# Patient Record
Sex: Female | Born: 1960 | Race: White | Hispanic: No | Marital: Married | Smoking: Current every day smoker
Health system: Southern US, Community
[De-identification: ages and names within clinical notes are randomized; demographics above are authoritative.]

## PROBLEM LIST (undated history)

## (undated) DIAGNOSIS — G8929 Other chronic pain: Secondary | ICD-10-CM

## (undated) DIAGNOSIS — M1990 Unspecified osteoarthritis, unspecified site: Secondary | ICD-10-CM

## (undated) DIAGNOSIS — F419 Anxiety disorder, unspecified: Secondary | ICD-10-CM

## (undated) DIAGNOSIS — T7840XA Allergy, unspecified, initial encounter: Secondary | ICD-10-CM

## (undated) DIAGNOSIS — K5732 Diverticulitis of large intestine without perforation or abscess without bleeding: Secondary | ICD-10-CM

## (undated) DIAGNOSIS — Z87442 Personal history of urinary calculi: Secondary | ICD-10-CM

## (undated) DIAGNOSIS — Z9889 Other specified postprocedural states: Secondary | ICD-10-CM

## (undated) DIAGNOSIS — G2581 Restless legs syndrome: Secondary | ICD-10-CM

## (undated) DIAGNOSIS — B009 Herpesviral infection, unspecified: Secondary | ICD-10-CM

## (undated) DIAGNOSIS — E039 Hypothyroidism, unspecified: Secondary | ICD-10-CM

## (undated) DIAGNOSIS — E785 Hyperlipidemia, unspecified: Secondary | ICD-10-CM

## (undated) DIAGNOSIS — F32A Depression, unspecified: Secondary | ICD-10-CM

## (undated) DIAGNOSIS — M79606 Pain in leg, unspecified: Secondary | ICD-10-CM

## (undated) DIAGNOSIS — R112 Nausea with vomiting, unspecified: Secondary | ICD-10-CM

## (undated) DIAGNOSIS — F329 Major depressive disorder, single episode, unspecified: Secondary | ICD-10-CM

## (undated) HISTORY — PX: KNEE SURGERY: SHX244

## (undated) HISTORY — DX: Allergy, unspecified, initial encounter: T78.40XA

## (undated) HISTORY — DX: Herpesviral infection, unspecified: B00.9

## (undated) HISTORY — DX: Hyperlipidemia, unspecified: E78.5

## (undated) HISTORY — PX: COLONOSCOPY: SHX174

## (undated) HISTORY — DX: Restless legs syndrome: G25.81

## (undated) HISTORY — DX: Anxiety disorder, unspecified: F41.9

## (undated) HISTORY — DX: Diverticulitis of large intestine without perforation or abscess without bleeding: K57.32

---

## 1998-09-14 ENCOUNTER — Other Ambulatory Visit: Admission: RE | Admit: 1998-09-14 | Discharge: 1998-09-14 | Payer: Self-pay | Admitting: Obstetrics & Gynecology

## 1999-09-26 ENCOUNTER — Other Ambulatory Visit: Admission: RE | Admit: 1999-09-26 | Discharge: 1999-09-26 | Payer: Self-pay | Admitting: Obstetrics & Gynecology

## 2000-10-18 ENCOUNTER — Other Ambulatory Visit: Admission: RE | Admit: 2000-10-18 | Discharge: 2000-10-18 | Payer: Self-pay | Admitting: Obstetrics & Gynecology

## 2001-01-15 ENCOUNTER — Emergency Department (HOSPITAL_COMMUNITY): Admission: EM | Admit: 2001-01-15 | Discharge: 2001-01-15 | Payer: Self-pay | Admitting: Emergency Medicine

## 2001-10-06 ENCOUNTER — Other Ambulatory Visit: Admission: RE | Admit: 2001-10-06 | Discharge: 2001-10-06 | Payer: Self-pay | Admitting: Obstetrics & Gynecology

## 2002-10-09 ENCOUNTER — Other Ambulatory Visit: Admission: RE | Admit: 2002-10-09 | Discharge: 2002-10-09 | Payer: Self-pay | Admitting: Obstetrics & Gynecology

## 2003-10-20 ENCOUNTER — Other Ambulatory Visit: Admission: RE | Admit: 2003-10-20 | Discharge: 2003-10-20 | Payer: Self-pay | Admitting: Obstetrics & Gynecology

## 2003-10-26 ENCOUNTER — Emergency Department (HOSPITAL_COMMUNITY): Admission: EM | Admit: 2003-10-26 | Discharge: 2003-10-26 | Payer: Self-pay | Admitting: *Deleted

## 2004-09-15 ENCOUNTER — Observation Stay (HOSPITAL_COMMUNITY): Admission: EM | Admit: 2004-09-15 | Discharge: 2004-09-15 | Payer: Self-pay | Admitting: Podiatry

## 2005-08-29 ENCOUNTER — Other Ambulatory Visit: Admission: RE | Admit: 2005-08-29 | Discharge: 2005-08-29 | Payer: Self-pay | Admitting: Obstetrics & Gynecology

## 2005-08-30 ENCOUNTER — Other Ambulatory Visit: Admission: RE | Admit: 2005-08-30 | Discharge: 2005-08-30 | Payer: Self-pay | Admitting: Obstetrics & Gynecology

## 2010-02-13 ENCOUNTER — Emergency Department (HOSPITAL_COMMUNITY): Admission: EM | Admit: 2010-02-13 | Discharge: 2010-02-13 | Payer: Self-pay | Admitting: Emergency Medicine

## 2011-01-16 DIAGNOSIS — K5732 Diverticulitis of large intestine without perforation or abscess without bleeding: Secondary | ICD-10-CM

## 2011-01-16 HISTORY — DX: Diverticulitis of large intestine without perforation or abscess without bleeding: K57.32

## 2011-02-02 NOTE — H&P (Signed)
NAMEELENIE, COVEN NO.:  [id number]   MEDICAL RECORD NO.:  [id number]          PATIENT TYPE:  EMS   LOCATION:  MAJO                         FACILITY:  [facility]   PHYSICIAN:  Hettie Holstein, D.O.    DATE OF BIRTH:  17-Jun-1961   DATE OF ADMISSION:  09/15/2004  DATE OF DISCHARGE:                                HISTORY & PHYSICAL   Primary care physician:  Mosetta Putt, M.D.  Gynecologist:  Ilda Mori, M.D.   HISTORY OF PRESENT ILLNESS:  Ms. Metheny is a pleasant 50 year old Caucasian  female with a past medical history of benzodiazepine dependence and anxiety,  who presents with an intentional drug overdose.  She was witnessed by her  boyfriend to take a handful of pills mixed between an assortment of Xanax  and Valium.  Absolute composition not known.  In addition, Ms. Gilbert had  consumed approximately six beers.  This occurred around 3:30 a.m.  EMS  records currently not available to me; however, it is reported that she had  two bottles, that her boyfriend was quite certain no other drugs were  involved.   She has no prior history of suicidal attempts in the past.  It is unclear  whether she attempted to take her life at this time.  She stated that about  a week ago she was instructed to cut back on her Xanax and in combination  with the stress of the holidays, she felt as though this caused her to  decompensate.   PAST MEDICAL HISTORY:  Only available through her.  She is not the best  historian at this time and she is still a bit lethargic, however more alert  after receiving Romazicon in the emergency department.  She provides that  she has been on benzodiazepines for almost 25 years.  She has been on Xanax  for 25 and Valium for five years.  She states that these were initiated for  assistance in controlling her problems with endometriosis.  She denies any  surgical history.   MEDICATIONS:  1.  She takes Xanax 0.5 mg prescribed b.i.d.  Her boyfriend  states she takes      it t.i.d.  2.  Valium to take 5 mg q.h.s. p.r.n.   ALLERGIES:  She reports multiple and various drug allergies and  intolerances.  Uncertain that all of these are allergies, though she does  report respiratory difficulties with ASPIRIN and BENADRYL as well as  CODEINE.   SOCIAL HISTORY:  She is a pack per day smoker for many years.  She does  drink alcohol about twice per week, though she is able to stop for long  periods of time.  She works for a [employer].  She works as a Leisure centre manager.  She has no children.  She is accompanied by her boyfriend here in the  emergency department for two years.   FAMILY HISTORY:  Both parents are alive and well in their 49s, afflicted  only with diabetes.   REVIEW OF SYSTEMS:  This is unobtainable from the patient at this time.  As  noted above, she is quite a poor historian.   PHYSICAL EXAMINATION:  VITAL SIGNS:  Blood pressure 108/60, temperature  97.2, heart rate 94, respirations 20, O2 saturation on room air 100%.  GENERAL:  The patient is awake, though a bit lethargic, falling asleep  during our discussion.  She is able to answer simple questions.  Her affect  is a bit labile.  She is giggling at times and then somnolent.  HEENT:  Head normocephalic, atraumatic.  Extraocular muscles are intact.  Pupils are equal and reactive bilaterally.  NECK:  Supple, nontender.  There is no palpable thyromegaly or mass.  CARDIOVASCULAR:  She has normal S1, S2.  CHEST:  Lungs are clear to auscultation bilaterally.  ABDOMEN:  Soft, nontender.  EXTREMITIES:  No edema.  Peripheral pulses are palpable and symmetrical.  NEUROLOGIC:  Neurologic exam reveals, as noted above, her to have a labile  affect.  She exhibits no focal neurologic deficits.   LABORATORY DATA:  Only an I-STAT was performed in the emergency department,  revealing an initial pH of 7.96, PCO2 of 47, hemoglobin 14.3, sodium 146,  potassium 3.6, creatinine of 0.6, and  glucose 87.   IMPRESSION:  1.  Intentional drug overdose with benzodiazepines.  2.  Chronic benzodiazepine use and dependence.  3.  Anxiety and depression.  4.  Tobacco dependence.   PLAN:  At this time we are going to admit Ms. Jacome for observation on a  telemetry floor.  If she exhibits no evidence of decompensation over the  next 23 hours, would recommend discontinuing the telemetry.  We have asked  the assistance of Dr. Jeanie Sewer of psychiatry to assist in the management of  Ms. Peachey' presenting illness with suicidal attempt with a drug overdose.  Will follow her course clinically, place Romazicon for ready access if she  does decompensate on the floor.  Will follow her continuous pulse oximetry  and administer gentle IV fluids, as she is dehydrated at this time.  Will  replete her potassium as well.      Eric   ESS/MEDQ  D:  09/15/2004  T:  09/15/2004  Job:  [id number]   cc:   Mosetta Putt, M.D.  [address] [place]  [place] [address]  Fax: [phone]

## 2011-02-09 ENCOUNTER — Inpatient Hospital Stay (HOSPITAL_COMMUNITY)
Admission: EM | Admit: 2011-02-09 | Discharge: 2011-02-14 | DRG: 872 | Disposition: A | Discharge status: Home / Self Care | Payer: Self-pay | Attending: Internal Medicine | Admitting: Internal Medicine

## 2011-02-09 ENCOUNTER — Emergency Department (HOSPITAL_COMMUNITY): Payer: Self-pay

## 2011-02-09 DIAGNOSIS — K630 Abscess of intestine: Secondary | ICD-10-CM | POA: Diagnosis present

## 2011-02-09 DIAGNOSIS — A419 Sepsis, unspecified organism: Principal | ICD-10-CM | POA: Diagnosis present

## 2011-02-09 DIAGNOSIS — F17200 Nicotine dependence, unspecified, uncomplicated: Secondary | ICD-10-CM | POA: Diagnosis present

## 2011-02-09 DIAGNOSIS — E279 Disorder of adrenal gland, unspecified: Secondary | ICD-10-CM | POA: Diagnosis present

## 2011-02-09 DIAGNOSIS — F1010 Alcohol abuse, uncomplicated: Secondary | ICD-10-CM | POA: Diagnosis present

## 2011-02-09 DIAGNOSIS — D509 Iron deficiency anemia, unspecified: Secondary | ICD-10-CM | POA: Diagnosis present

## 2011-02-09 DIAGNOSIS — F411 Generalized anxiety disorder: Secondary | ICD-10-CM | POA: Diagnosis present

## 2011-02-09 DIAGNOSIS — E785 Hyperlipidemia, unspecified: Secondary | ICD-10-CM | POA: Diagnosis present

## 2011-02-09 DIAGNOSIS — K5732 Diverticulitis of large intestine without perforation or abscess without bleeding: Secondary | ICD-10-CM | POA: Diagnosis present

## 2011-02-09 DIAGNOSIS — E876 Hypokalemia: Secondary | ICD-10-CM | POA: Diagnosis present

## 2011-02-09 LAB — URINALYSIS, ROUTINE W REFLEX MICROSCOPIC
Bilirubin Urine: NEGATIVE
Glucose, UA: NEGATIVE mg/dL
Leukocytes, UA: NEGATIVE
Nitrite: NEGATIVE
Urobilinogen, UA: 1 mg/dL (ref 0.0–1.0)

## 2011-02-09 LAB — DIFFERENTIAL
Basophils Absolute: 0.1 10*3/uL (ref 0.0–0.1)
Eosinophils Absolute: 0.3 10*3/uL (ref 0.0–0.7)
Eosinophils Relative: 2 % (ref 0–5)
Lymphocytes Relative: 10 % — ABNORMAL LOW (ref 12–46)
Lymphs Abs: 1.8 10*3/uL (ref 0.7–4.0)
Monocytes Absolute: 1.5 10*3/uL — ABNORMAL HIGH (ref 0.1–1.0)
Monocytes Relative: 8 % (ref 3–12)
Neutro Abs: 14.3 10*3/uL — ABNORMAL HIGH (ref 1.7–7.7)

## 2011-02-09 LAB — URINE MICROSCOPIC-ADD ON

## 2011-02-09 LAB — COMPREHENSIVE METABOLIC PANEL
BUN: 8 mg/dL (ref 6–23)
Calcium: 9.2 mg/dL (ref 8.4–10.5)
Chloride: 98 mEq/L (ref 96–112)
Creatinine, Ser: 0.59 mg/dL (ref 0.4–1.2)
GFR calc Af Amer: >60 mL/min (ref >60)
Potassium: 3.8 mEq/L (ref 3.5–5.1)
Sodium: 136 mEq/L (ref 135–145)

## 2011-02-09 LAB — WET PREP, GENITAL
Trich, Wet Prep: NONE SEEN
WBC, Wet Prep HPF POC: NONE SEEN
Yeast Wet Prep HPF POC: NONE SEEN

## 2011-02-09 LAB — CBC
MCHC: 33.1 g/dL (ref 30.0–36.0)
Platelets: 313 10*3/uL (ref 150–400)
RBC: 4.33 MIL/uL (ref 3.87–5.11)
RDW: 13.2 % (ref 11.5–15.5)

## 2011-02-10 ENCOUNTER — Emergency Department (HOSPITAL_COMMUNITY): Payer: Self-pay

## 2011-02-10 ENCOUNTER — Inpatient Hospital Stay (HOSPITAL_COMMUNITY): Payer: Self-pay

## 2011-02-10 LAB — MAGNESIUM: Magnesium: 2.1 mg/dL (ref 1.5–2.5)

## 2011-02-10 LAB — PHOSPHORUS: Phosphorus: 3 mg/dL (ref 2.3–4.6)

## 2011-02-10 LAB — APTT: aPTT: 41 seconds — ABNORMAL HIGH (ref 24–37)

## 2011-02-10 LAB — LACTIC ACID, PLASMA: Lactic Acid, Venous: 0.7 mmol/L (ref 0.5–2.2)

## 2011-02-10 LAB — PROTIME-INR: INR: 1.15 (ref 0.00–1.49)

## 2011-02-10 MED ORDER — IOHEXOL 300 MG/ML  SOLN
125.0000 mL | Freq: Once | INTRAMUSCULAR | Status: AC | PRN
  Administered 2011-02-10: 125 mL via INTRAVENOUS

## 2011-02-10 NOTE — Consult Note (Signed)
NAMELAQUESHIA, CIHLAR                 ACCOUNT NO.:  [id number]  MEDICAL RECORD NO.:  [id number]           PATIENT TYPE:  I  LOCATION:  1224                         [facility]ACILITY:  [facility]  PHYSICIAN:  Angelia Mould. Derrell Lolling, M.D.DATE OF BIRTH:  1960-12-09  DATE OF CONSULTATION:  02/10/2011 DATE OF DISCHARGE:                                CONSULTATION   REASON FOR CONSULTATION:  Evaluate diverticulitis.  HISTORY OF PRESENT ILLNESS:  This is a 50 year old white female who noticed sudden onset of lower abdominal pain approximately 48 hours ago. She has been anorexic and has not eaten much since.  She has not had any vomiting.  She is chronically constipated.  Denies seeing blood in her stools.  She presented to the emergency room late last night.  Blood work showed white blood cell count of 17,800, hemoglobin 12.8.  Complete metabolic panel which was normal and lipase of 18.  CT scan showed inflamed sigmoid colon and diverticula consistent with sigmoid diverticulitis.  There was a phlegmon associated with the sigmoid colon but there was no drainable abscess.  No free air and no obstruction. The radiologist had some concern for microperforation because of the phlegmon.  Also noted was a 2.6 cm left adrenal mass which was nonspecific.  She was admitted by the medical hospitalist service and I was asked to see her in consultation.  PAST HISTORY:  She has hyperlipidemia.  Tobacco abuse.  Endoscopy for gastric polyp by history.  Never pregnant.  She has chronic anxiety. She has never had an abdominal operation.  CURRENT MEDICATIONS: 1. MiraLax. 2. Crestor. 3. Xanax.  DRUG ALLERGIES:  ASPIRIN, BENADRYL, PENICILLIN, DARVON, ADVIL, TYLENOL.  FAMILY HISTORY:  Father deceased, had hypertension and multiple myeloma. Mother living, has diabetes.  SOCIAL HISTORY:  The patient is single but has a long time live-in boyfriend.  She is unemployed.  She drinks wine weekly.  She smokes 1 pack of cigarettes  per day.  REVIEW OF SYSTEMS:  10-system review of systems is performed and is noncontributory and negative, except as described above.  PHYSICAL EXAMINATION:  GENERAL:  Pleasant, minimally confused, basically oriented Caucasian female who is in mild distress.  She does not look toxic at all. VITAL SIGNS:  Temperature 97.7, heart rate 91, blood pressure 109/68, oxygen saturation 94% on room air. HEENT:  Eyes, sclerae clear.  Extraocular movements intact.  Ears, mouth, throat, nose, lips, tongue and oropharynx without gross lesions. NECK:  Supple, nontender.  No mass.  No jugular venous distention. LUNGS:  Clear to auscultation.  No chest wall tenderness. BREASTS:  Not examined. HEART:  Regular rate and rhythm.  No ectopy.  No murmur. ABDOMEN:  Does not appear to be distended but there are minimal bowel sounds.  There are no scars.  The liver and spleen are not enlarged. There is no mass.  She is tender with guarding all the way across the lower abdomen.  Much softer in upper abdomen.  This is not a localizing process.  There is no palpable mass. GENITOURINARY:  There is no inguinal mass or adenopathy. EXTREMITIES:  She moves all 4 extremities  well without pain or deformity. NEUROLOGIC:  No gross motor or sensory deficits.  LABORATORY DATA:  Data ordered and reviewed.  I reviewed her CT scan of the abdomen and pelvis and all of her lab work and I have discussed her case with the [facility].  ASSESSMENT: 1. Acute sigmoid diverticulitis with possible microperforation and     localized peritonitis.  There is no evidence of obstruction, free     air or abscess at this time. 2. History of tobacco abuse. 3. History of regular alcohol use. 4. Left adrenal mass. 5. Chronic anxiety.  PLAN: 1. The patient needs to be admitted to the hospital, placed at bowel     rest and placed on broad-spectrum antibiotics. 2. Attention should be made to rehydrating her, as she does look a      little bit dehydrated. 3. I will follow along with you.  Hopefully, her peritonitis will     subside and we will be able to further evaluate and advise her     regarding long-term management of her diverticulitis as an     outpatient. 4. If she remains tender or her symptoms progress, she may require     laparotomy and Hartmann resection during this hospitalization.     This has been discussed with the patient in great detail.     Angelia Mould. Derrell Lolling, M.D.     HMI/MEDQ  D:  02/10/2011  T:  02/10/2011  Job:  [id number]  cc:   Mosetta Putt, M.D. Fax: [phone]  Electronically Signed by Claud Kelp M.D. on 02/10/2011 11:16:21 PM

## 2011-02-11 LAB — COMPREHENSIVE METABOLIC PANEL
AST: 13 U/L (ref 0–37)
BUN: 5 mg/dL — ABNORMAL LOW (ref 6–23)
CO2: 25 mEq/L (ref 19–32)
Calcium: 8.1 mg/dL — ABNORMAL LOW (ref 8.4–10.5)
Chloride: 105 mEq/L (ref 96–112)
Glucose, Bld: 90 mg/dL (ref 70–99)

## 2011-02-11 LAB — CBC
MCH: 29 pg (ref 26.0–34.0)
MCHC: 32.5 g/dL (ref 30.0–36.0)
MCV: 89.3 fL (ref 78.0–100.0)

## 2011-02-11 LAB — URINE CULTURE
Culture  Setup Time: 201205260221
Culture: NO GROWTH

## 2011-02-12 LAB — CBC
MCHC: 33.3 g/dL (ref 30.0–36.0)
RDW: 13.2 % (ref 11.5–15.5)

## 2011-02-12 LAB — BASIC METABOLIC PANEL
Calcium: 8.4 mg/dL (ref 8.4–10.5)
GFR calc Af Amer: >60 mL/min (ref >60)
GFR calc non Af Amer: >60 mL/min (ref >60)
Sodium: 139 mEq/L (ref 135–145)

## 2011-02-12 LAB — FOLATE: Folate: >20 ng/mL

## 2011-02-12 LAB — DIFFERENTIAL
Basophils Absolute: 0 10*3/uL (ref 0.0–0.1)
Basophils Relative: 0 % (ref 0–1)
Eosinophils Relative: 4 % (ref 0–5)
Monocytes Absolute: 0.7 10*3/uL (ref 0.1–1.0)
Neutro Abs: 5.8 10*3/uL (ref 1.7–7.7)

## 2011-02-12 LAB — FERRITIN: Ferritin: 129 ng/mL (ref 10–291)

## 2011-02-13 LAB — CBC
HCT: 31.3 % — ABNORMAL LOW (ref 36.0–46.0)
Hemoglobin: 10.1 g/dL — ABNORMAL LOW (ref 12.0–15.0)
MCH: 28.4 pg (ref 26.0–34.0)
MCHC: 32.3 g/dL (ref 30.0–36.0)

## 2011-02-13 LAB — BASIC METABOLIC PANEL
CO2: 23 mEq/L (ref 19–32)
Calcium: 8.5 mg/dL (ref 8.4–10.5)
Glucose, Bld: 119 mg/dL — ABNORMAL HIGH (ref 70–99)
Sodium: 139 mEq/L (ref 135–145)

## 2011-02-14 LAB — BASIC METABOLIC PANEL
CO2: 22 mEq/L (ref 19–32)
Chloride: 105 mEq/L (ref 96–112)
Creatinine, Ser: 0.56 mg/dL (ref 0.4–1.2)
GFR calc Af Amer: >60 mL/min (ref >60)
Potassium: 3.7 mEq/L (ref 3.5–5.1)

## 2011-02-15 NOTE — Discharge Summary (Addendum)
NAMECATHEY, Beth Walters                 ACCOUNT NO.:  [id number]  MEDICAL RECORD NO.:  [id number]           PATIENT TYPE:  I  LOCATION:  1319                         [facility]ACILITY:  [facility]  PHYSICIAN:  Andreas Blower, MD       DATE OF BIRTH:  10-06-1960  DATE OF ADMISSION:  02/09/2011 DATE OF DISCHARGE:  02/14/2011                              DISCHARGE SUMMARY   PRIMARY CARE PROVIDER:  Mosetta Putt, M.D.  GENERAL SURGEON:  Angelia Mould. Derrell Lolling, M.D.  DISCHARGE DIAGNOSES: 1. Diverticulitis with possible microperforation. 2. Systemic inflammatory response syndrome. 3. Adrenal mass. 4. Anxiety. 5. History of EtOH. 6. Anemia. 7. Hypokalemia. 8. Tobacco use.  DISCHARGE MEDICATIONS: 1. Ciprofloxacin 500 mg p.o. b.i.d. for 14 days. 2. Flagyl 500 mg p.o. t.i.d. for 14 days. 3. Phenergan 12.5 mg p.o. every 6 hours as needed for nausea. 4. Crestor 10 mg p.o. every evening. 5. Xanax 0.5 mg p.o. every 8 hours as needed for anxiety.  DIAGNOSTIC LABS:  WBC 17.8, hemoglobin 12.6, hematocrit 31.8, neutrophils 80%, absolute neutrophils 14.3.  Sodium 136, potassium 3.8, chloride 98, CO2 25, BUN 8, creatinine 0.59, glucose 104, lipase 18. Urinalysis with large blood, trace ketones, few bacteria, 3 to 6 RBCs. Wet prep with few clue cells, no yeasts, no trich.  MRSA PCR screening was negative.  PT 14.9, INR 1.15, PTT 41, magnesium 2.1, phosphorous 3.0.  Lactic acid 0.7.  Urine culture with no growth.  GC/Chlamydia negative.  Anemia panel yields iron level of 15, total iron binding capacity 255, percent saturation 6.  Vitamin B12 291, folate greater than 20.0.  DIAGNOSTIC IMAGING:  CT of the abdomen and pelvis yields prominent acute diverticulitis along the sigmoid colon with a large 8.5 x 4.9 cm phlegmon in the left in hemipelvis.  Associated large inflamed diverticula noted, underlying microperforation is a concern.  No definite abscess was seen.  Small amount of free fluid noted on the right side  of the pelvis.  Diverticulosis involving the descending and sigmoid colon.  Left ovary not well assessed due to adjacent inflammation.  A 2.6-cm mildly heterogenesis left adrenal mass noted. Small hiatal hernia noted.  Scattered calcification along the abdominal aorta and its branches.  Mild bibasilar atelectasis noted.  Abdominal and transvaginal ultrasound of pelvis yields question of adenomyosis given history of endometriosis and vague areas of decreased echogenicity within the uterine wall.  Moderate amount of free fluid within the pelvis is likely physiologic in nature.  No sonographic evidence for ovarian torsion.  CONSULTATIONS:  Dr. Claud Kelp from General Surgery on Feb 10, 2011.  PROCEDURES:  None.  BRIEF HISTORY:  Ms. Beth Walters is a 50 year old with a past medical history significant for anxiety, history of ovarian cyst and endometriosis who presented to the emergency room complaining of abdominal pain.  She also reported nausea and difficulty with urinating and defecating due to the abdominal pain.  She denied any diarrhea, dysuria or hematuria.  She denied fevers or chills.  She indicated the abdominal pain had started 2 days prior and described as crampy and sharp in quality and intermittent in nature.  She  indicated she felt like pain was getting worse, so she came to the emergency room.  She did report one episode of emesis in the emergency department.  She denied chest pain, shortness of breath, melena, rectal bleeding, vaginal bleeding.  We were asked to admit for further evaluation and treatment.  HOSPITAL COURSE BY PROBLEM: 1. Diverticulitis with possible microperforation.  The patient was     admitted to a regular floor.  She was evaluated by Dr. Claud Kelp on Feb 10, 2011.  CT of the abdomen showed an inflamed colon     and diverticula consistent with diverticulitis.  A phlegmon was     associated with the sigmoid colon but there was no drainable      abscess and no free air or obstruction.  There was some concern for     microperforation.  She was placed on bowel rest and IV Cipro and     Flagyl.  She gradually responded favorably with decreased pain.     She was started on a diet on Feb 12, 2011, but was advanced on Feb 13, 2011.  She tolerated well.  On the day of discharge, her pain     was within tolerable limits and she was tolerating her diet and     having formed bowel movements.  She will go home on 14 days of p.o.     Cipro and Flagyl.  She is to call Dr. Jacinto Halim office and make an     appointment with him for 2 weeks and he will assist her with long     term management of her diverticulosis.  She will remain on a low     residue diet for 2 weeks and then will go to a high-fiber diet. 2. Systemic inflammatory response syndrome.  On admission the patient     had a white count of 17.8, mild fever of 99.0, was tachycardic and     a systolic blood pressure of 100.  She responded favorably to IV     fluids and antibiotics.  She was afebrile during her     hospitalization with the exception of mild fever in the ED and her     white count is within normal limits. 3. Adrenal mass.  It was an incidental finding on CT.  She will     probably need an outpatient MRI to further evaluate.  She has an     appointment with her primary care physician in July.  She will     discuss this with him at that time. 4. Anemia secondary to iron deficiency.  Hemoglobin remained stable     during her hospitalization. 5. Anxiety.  Remained at baseline during hospitalization.  Initially     she was given IV Ativan.  On Feb 13, 2011, she was switched back to     her home dose of Xanax and Ativan was DC'ed 6. Hypokalemia.  On admission, her potassium was 3.3 that was repleted     and her potassium remained within normal limits. 7. History of EtOH use. 8. History of tobacco use.  The patient was counseled regarding     cessation and advised of the  risks of tobacco use.  PHYSICAL EXAMINATION:  Physical examination is documented in rounding note dated Feb 14, 2011.  FOLLOWUP:  The patient will call for appointment with Dr. Derrell Lolling in 2 weeks.  Has an appointment with Dr. Mosetta Putt, her  primary care provider, in July.  Will discuss evaluating adrenal mass and possibility of an outpatient MRI.  Will take 14 days of p.o. Cipro and Flagyl.  DIET:  Low residue diet for 2 weeks.  ACTIVITY:  Ad lib.  DISPOSITION:  The patient is medically stable and ready for discharge to home.  Time spent on this dictation was 40 minutes.     Gwenyth Bender, NP   ______________________________ Andreas Blower, MD    KMB/MEDQ  D:  02/14/2011  T:  02/14/2011  Job:  [id number]  cc:   Mosetta Putt, M.D. Fax: [phone]  Angelia Mould. Derrell Lolling, M.D. [address], [address] [place] [address]  Electronically Signed by Wardell Heath REDDY  on 02/15/2011 05:24:16 PM Electronically Signed by Toya Smothers  on 03/07/2011 07:31:19 AM

## 2011-02-16 LAB — CULTURE, BLOOD (ROUTINE X 2)
Culture  Setup Time: 201205262019
Culture: NO GROWTH

## 2011-02-18 NOTE — H&P (Signed)
Beth Walters, STROUGH                 ACCOUNT NO.:  [id number]  MEDICAL RECORD NO.:  [id number]           PATIENT TYPE:  E  LOCATION:  WLED                         FACILITY:  [facility]  PHYSICIAN:  Hartley Barefoot, MD    DATE OF BIRTH:  1961-02-23  DATE OF ADMISSION:  02/09/2011 DATE OF DISCHARGE:                             HISTORY & PHYSICAL   CHIEF COMPLAINT:  Abdominal pain.  BRIEF HISTORY OF PRESENT ILLNESS:  This is a 50 year old with past medical history significant for anxiety, history of ovarian cyst, and endometriosis, who presents to the emergency department complaining of abdominal pain.  She also relayed nausea and difficulty with urinating and defecating due to abdominal pain.  She denies diarrhea, dysuria, or hematuria.  No fevers or chills.  The abdominal pain started 2 days prior to admission, 10/10, intermittent, crampy and sharp in quality. The patient feels the pain is getting worse.  She vomited once in the emergency department.  She denies chest pain, shortness of breath, melena, rectal bleeding, vaginal bleeding, or discharge.  ALLERGIES:  She has allergy to; 1. PENICILLIN. 2. ASPIRIN. 3. BENADRYL. 4. TYLENOL. 5. IBUPROFEN  MEDICATIONS:  She takes Crestor and Xanax.  REVIEW OF SYSTEMS:  Negative, except as per HPI.  PAST MEDICAL HISTORY: 1. Anxiety. 2. Endometriosis. 3. Hyperlipidemia. 4. Ovarian cyst. 5. Prior history of suicidal attempts.  PAST SURGICAL HISTORY:  History of knee surgery.  SOCIAL HISTORY:  She is unemployed.  She smokes a pack a day for many years.  She drinks alcohol 3 times per week and 4 glasses of wine.  She denies recreational drugs.  She is single, and she does not have children.  FAMILY HISTORY:  Both parents are in their 79s, have history of diabetes.  PHYSICAL EXAMINATION:  GENERAL:  The patient sitting in bed, in no acute distress. VITAL SIGNS:  Blood pressure 106/72, pulse 108, saturations 94 on room air, pulse 108,  respirations 16, temperature 99. HEENT:  Head atraumatic, normocephalic.  Eyes anicteric.  Pupils equal and reactive to light.  Extraocular muscles intact. NECK:  Supple.  No rigidity, no thyromegaly. CARDIOVASCULAR:  S1 and S2, tachycardic.  Regular rhythm and rate.  No rubs, murmurs, or gallops. ABDOMEN:  Bowel sounds are present.  Decreased tenderness in bilateral lower quadrant.  No rigidity, no guarding. EXTREMITIES:  Pulse present.  No edema. SKIN:  No rash. NEUROLOGIC:  Alert and oriented x3.  Cranial nerves II through XII intact.  Sensation grossly intact.  Motor strength 5/5 throughout.  ADMISSION LABORATORY DATA:  White blood cells 17, hemoglobin 12, hematocrit 38, platelets 313.  Sodium 136, potassium 3.8, chloride 104, bicarb 25, BUN 8, creatinine 0.59, glucose 104.  UA with bacteria, large blood.  Pregnancy test negative.  Lipase 18.  RADIOGRAPHIC STUDIES:  CT abdomen and pelvis showed prominent acute diverticulitis along the sigmoid colon, with a large 8.5 x 4.9 cm phlegmon in the left hemipelvis.  Associated large inflamed diverticula noted.  Underlying microperforation is concerned.  No definite abscess is seen, a small amount of free fluid noted on the pelvis. Diverticulosis involving the descending and  sigmoid colon.  Left ovary not well assessed due to adjacent inflammation.  2.6 cm mildly heterogenous left adrenal mass noted.  This could be further addressed per CT, MRI protocol.  Mild bibasilar atelectasis.  Vaginal ultrasound, question adenomyosis, given history of endometriosis and vague areas of decreased echogenicity within the uterine wall.  Moderate amount of free fluid within the pelvis is likely physiologic in nature.  No sonographic evidence of ovarian torsion.  ASSESSMENT AND PLAN:  This is a 50 year old who presented with abdominal pain, found to have acute diverticulitis, questionable microperforation and phlegmon. 1. Diverticulitis.  We will admit  the patient to step-down unit.  We     will start IV antibiotics, Cipro and Flagyl.  She has history of     allergy to penicillin.  For that reason, I will avoid Zosyn;     n.p.o.; bowel rest; IV fluids; pain control.  Surgery consulted     already. 2. Systemic inflammatory response syndrome, possible early sepsis.     Systolic blood pressure in the 100 range.  The patient has mild     fever at 99.  She has leukocytosis of 17.  She is tachycardic.  I     will start IV fluids.  Will check lactic acid.  Admit to the step-     down unit.  Continue with IV antibiotics. 3. Anxiety.  The patient is on Xanax.  I will start Ativan p.r.n. 4. History of alcohol use.  We will start preventive thiamine and     folate.  Monitor CIWA protocol. 5. Adrenal mass, incidental finding on CT abdomen.  She will need at     some point an MRI and a workup. 6. Tobacco use.  We will order a nicotine patch as needed.     Hartley Barefoot, MD     BR/MEDQ  D:  02/10/2011  T:  02/10/2011  Job:  [id number]  Electronically Signed by Hartley Barefoot MD on 02/18/2011 09:35:02 PM

## 2011-03-06 ENCOUNTER — Encounter (INDEPENDENT_AMBULATORY_CARE_PROVIDER_SITE_OTHER): Payer: Self-pay | Admitting: General Surgery

## 2011-03-06 ENCOUNTER — Other Ambulatory Visit (INDEPENDENT_AMBULATORY_CARE_PROVIDER_SITE_OTHER): Payer: Self-pay | Admitting: General Surgery

## 2011-03-06 DIAGNOSIS — K5792 Diverticulitis of intestine, part unspecified, without perforation or abscess without bleeding: Secondary | ICD-10-CM

## 2011-03-07 ENCOUNTER — Inpatient Hospital Stay: Admission: RE | Admit: 2011-03-07 | Payer: Self-pay | Source: Ambulatory Visit

## 2011-03-07 ENCOUNTER — Ambulatory Visit
Admission: RE | Admit: 2011-03-07 | Discharge: 2011-03-07 | Disposition: A | Discharge status: Home / Self Care | Payer: Self-pay | Source: Ambulatory Visit | Attending: General Surgery | Admitting: General Surgery

## 2011-03-07 DIAGNOSIS — K5792 Diverticulitis of intestine, part unspecified, without perforation or abscess without bleeding: Secondary | ICD-10-CM

## 2011-03-07 MED ORDER — IOHEXOL 300 MG/ML  SOLN
100.0000 mL | Freq: Once | INTRAMUSCULAR | Status: AC | PRN
  Administered 2011-03-07: 100 mL via INTRAVENOUS

## 2011-03-08 ENCOUNTER — Encounter (INDEPENDENT_AMBULATORY_CARE_PROVIDER_SITE_OTHER): Payer: Self-pay | Admitting: General Surgery

## 2011-04-17 ENCOUNTER — Ambulatory Visit (AMBULATORY_SURGERY_CENTER): Payer: Self-pay | Admitting: *Deleted

## 2011-04-17 VITALS — Ht 64.0 in | Wt 157.0 lb

## 2011-04-17 DIAGNOSIS — Z1211 Encounter for screening for malignant neoplasm of colon: Secondary | ICD-10-CM

## 2011-04-17 MED ORDER — PEG-KCL-NACL-NASULF-NA ASC-C 100 G PO SOLR: ORAL | Status: DC

## 2011-05-01 ENCOUNTER — Encounter: Payer: Self-pay | Admitting: Gastroenterology

## 2011-05-01 ENCOUNTER — Ambulatory Visit (AMBULATORY_SURGERY_CENTER): Payer: Self-pay | Admitting: Gastroenterology

## 2011-05-01 DIAGNOSIS — Z1211 Encounter for screening for malignant neoplasm of colon: Secondary | ICD-10-CM

## 2011-05-01 DIAGNOSIS — K5732 Diverticulitis of large intestine without perforation or abscess without bleeding: Secondary | ICD-10-CM

## 2011-05-01 DIAGNOSIS — D126 Benign neoplasm of colon, unspecified: Secondary | ICD-10-CM

## 2011-05-01 DIAGNOSIS — D128 Benign neoplasm of rectum: Secondary | ICD-10-CM

## 2011-05-01 DIAGNOSIS — D129 Benign neoplasm of anus and anal canal: Secondary | ICD-10-CM

## 2011-05-01 MED ORDER — SODIUM CHLORIDE 0.9 % IV SOLN: 500.0000 mL | INTRAVENOUS | Status: DC

## 2011-05-01 NOTE — Patient Instructions (Signed)
Discharge instructions given with verbal understanding.  Handouts on polyps and diverticulosis given.  Resume previous medications. 

## 2011-05-02 ENCOUNTER — Telehealth: Payer: Self-pay

## 2011-05-02 NOTE — Telephone Encounter (Signed)

## 2011-05-07 ENCOUNTER — Encounter: Payer: Self-pay | Admitting: Gastroenterology

## 2011-11-22 ENCOUNTER — Other Ambulatory Visit: Payer: Self-pay | Admitting: Family Medicine

## 2011-11-22 ENCOUNTER — Ambulatory Visit
Admission: RE | Admit: 2011-11-22 | Discharge: 2011-11-22 | Disposition: A | Discharge status: Home / Self Care | Payer: Self-pay | Source: Ambulatory Visit | Attending: Family Medicine | Admitting: Family Medicine

## 2011-11-22 DIAGNOSIS — M25562 Pain in left knee: Secondary | ICD-10-CM

## 2012-01-15 ENCOUNTER — Other Ambulatory Visit: Payer: Self-pay | Admitting: Family Medicine

## 2012-01-15 ENCOUNTER — Ambulatory Visit
Admission: RE | Admit: 2012-01-15 | Discharge: 2012-01-15 | Disposition: A | Discharge status: Home / Self Care | Payer: Self-pay | Source: Ambulatory Visit | Attending: Family Medicine | Admitting: Family Medicine

## 2012-01-15 DIAGNOSIS — R609 Edema, unspecified: Secondary | ICD-10-CM

## 2012-06-09 IMAGING — CR DG CHEST 2V
2 series · 2 of 2 positions shown · non-contrast
Comparison: None.

CLINICAL DATA: Fever.

CHEST - 2 VIEW

[w chest pa]
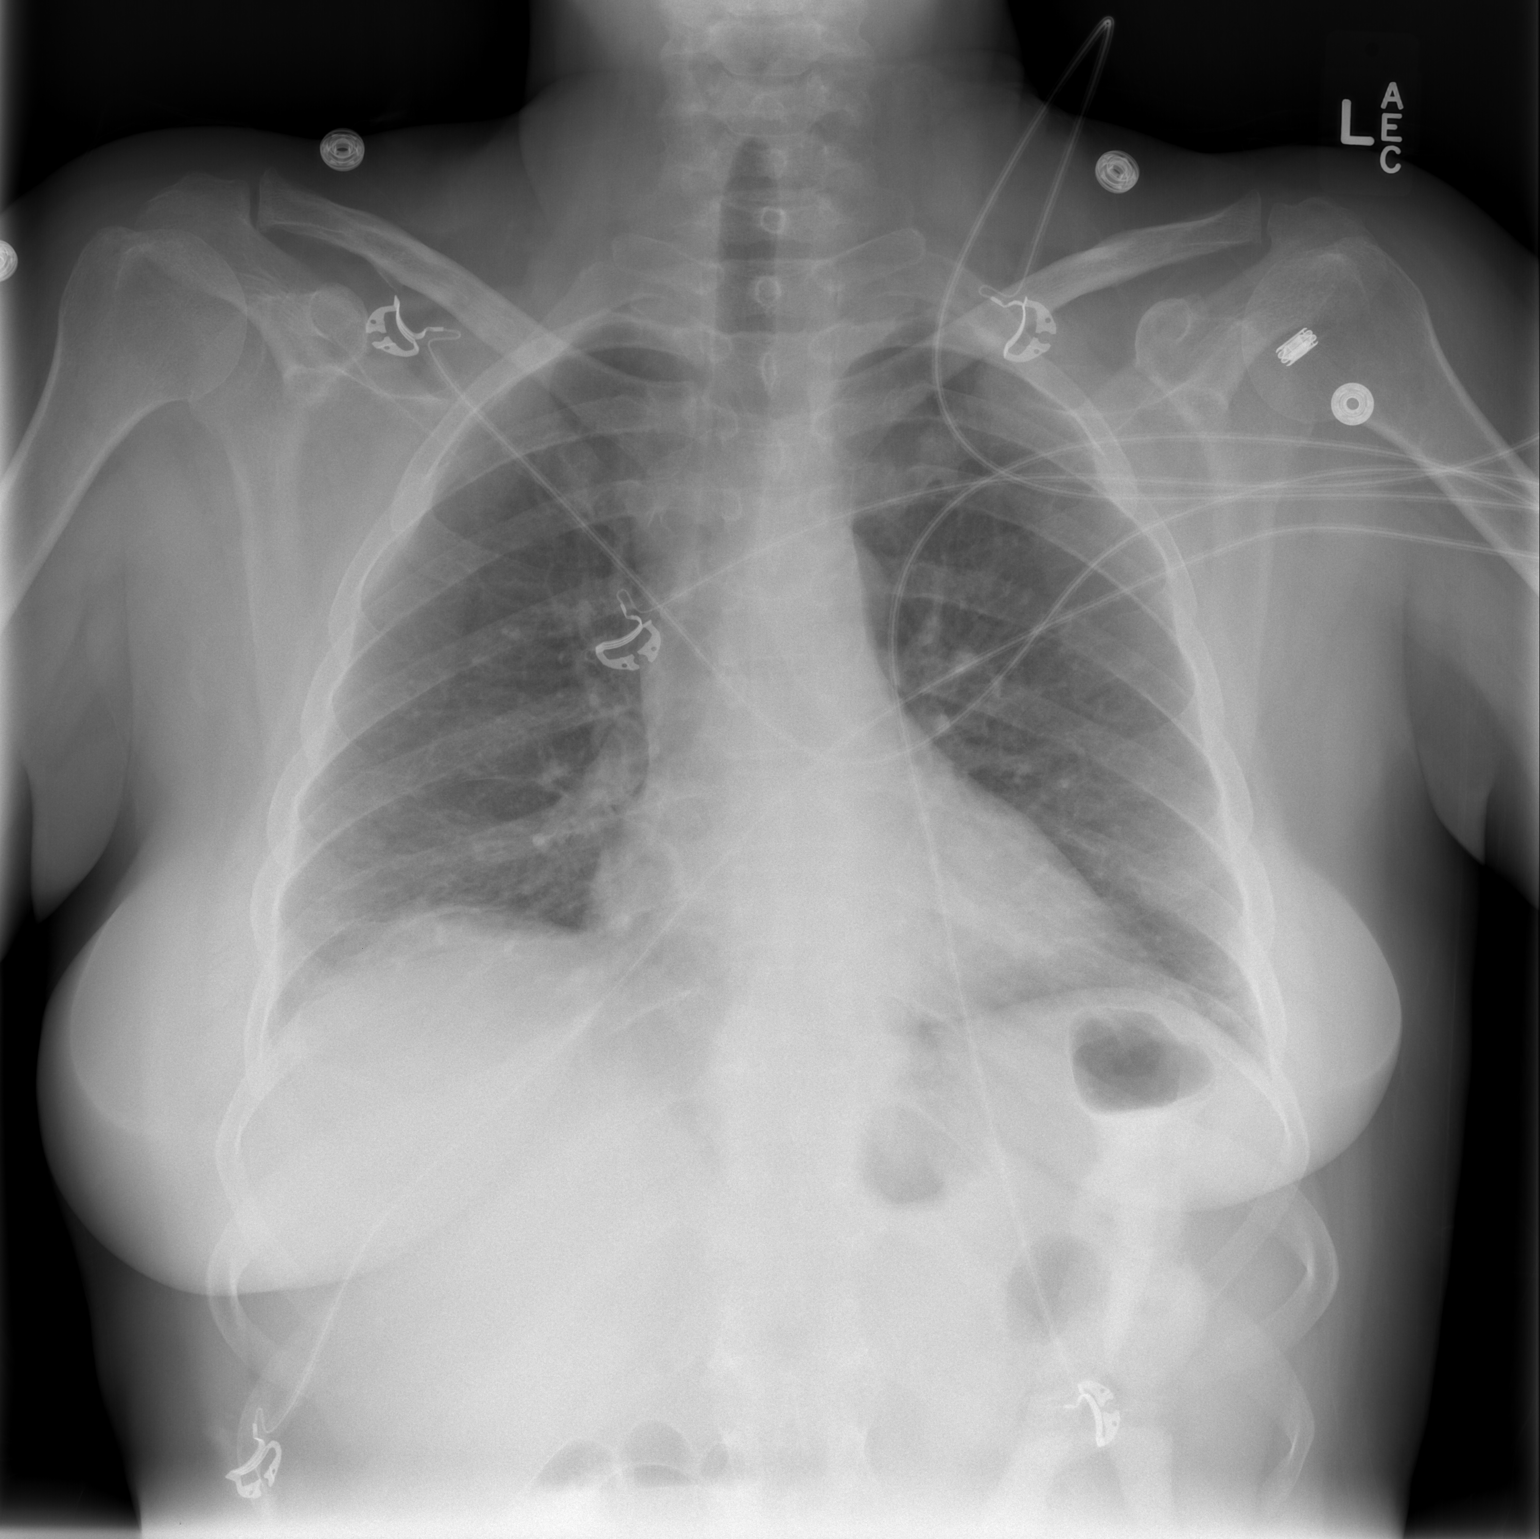

[w chest lat]
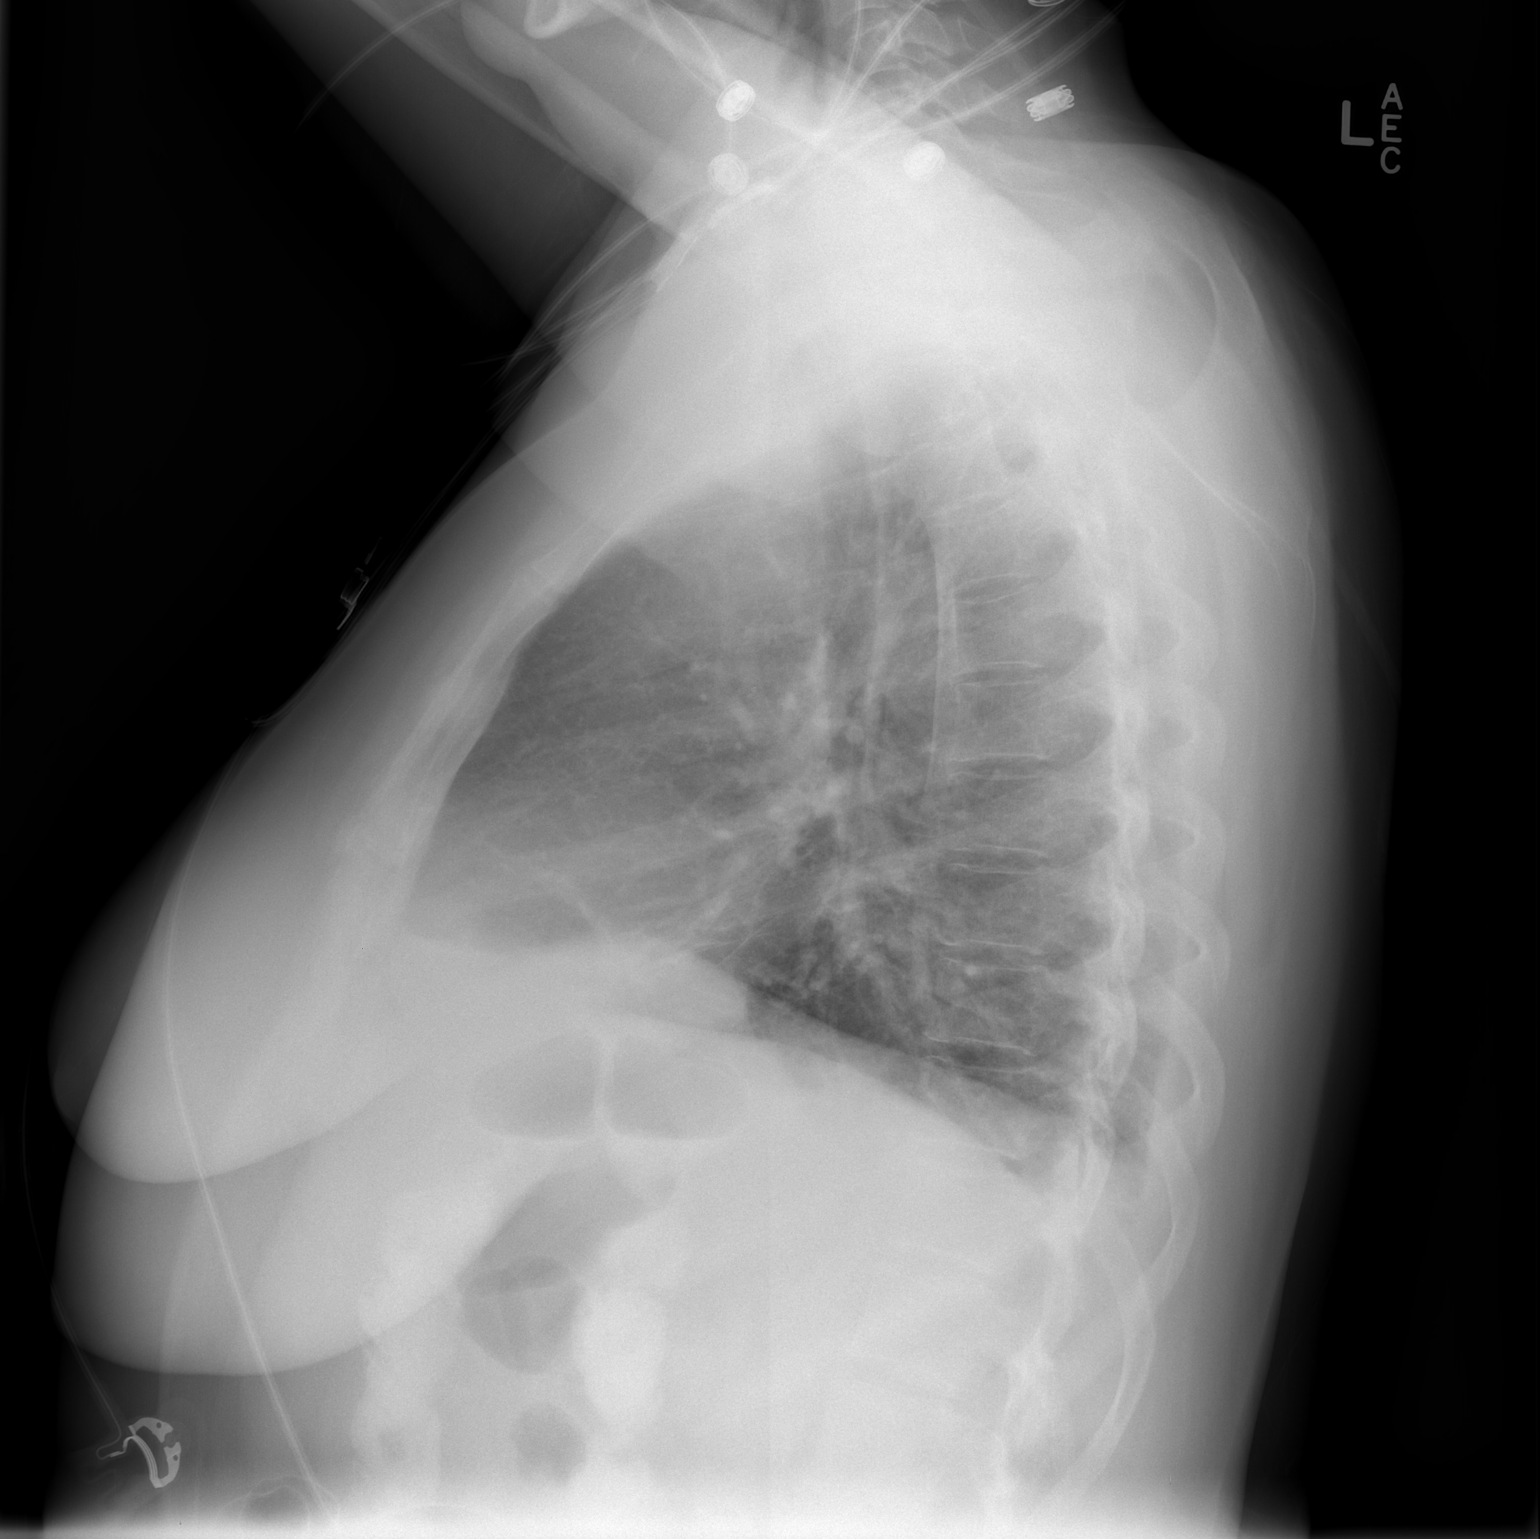

[2 of 2 positions shown; findings below may reference images not displayed]

FINDINGS: Normal sized heart.  Small amount of linear density at
the right lung base.  Clear left lung.  Minimal scoliosis.
IMPRESSION: Mild right basilar atelectasis or scarring.

## 2013-12-25 ENCOUNTER — Other Ambulatory Visit: Payer: Self-pay | Admitting: Family Medicine

## 2013-12-25 DIAGNOSIS — R1031 Right lower quadrant pain: Secondary | ICD-10-CM

## 2013-12-25 DIAGNOSIS — R1032 Left lower quadrant pain: Principal | ICD-10-CM

## 2013-12-30 ENCOUNTER — Other Ambulatory Visit: Payer: Self-pay

## 2016-02-07 ENCOUNTER — Encounter: Payer: Self-pay | Admitting: Gastroenterology

## 2018-04-23 ENCOUNTER — Ambulatory Visit: Payer: Self-pay | Admitting: Gastroenterology

## 2018-04-23 ENCOUNTER — Encounter: Payer: Self-pay | Admitting: Gastroenterology

## 2018-04-23 VITALS — BP 110/62 | HR 76 | Ht 63.0 in | Wt 153.8 lb

## 2018-04-23 DIAGNOSIS — Z86010 Personal history of colonic polyps: Secondary | ICD-10-CM

## 2018-04-23 DIAGNOSIS — R110 Nausea: Secondary | ICD-10-CM

## 2018-04-23 DIAGNOSIS — R1032 Left lower quadrant pain: Secondary | ICD-10-CM | POA: Insufficient documentation

## 2018-04-23 DIAGNOSIS — Z8719 Personal history of other diseases of the digestive system: Secondary | ICD-10-CM | POA: Insufficient documentation

## 2018-04-23 NOTE — Progress Notes (Signed)
04/23/2018 Beth Walters [id number] 09/11/1961   HISTORY OF PRESENT ILLNESS: This is a 57 year old female who is a patient of Dr. Lynne Leader.  She was hospitalized in May/June 2012 with significant diverticulitis.  She then underwent a follow-up colonoscopy in August 2012 at which time she was found to have moderate diverticulosis as well as a couple of polyps that were removed and were tubular adenomas.  Repeat colonoscopy was recommended in 5 years from that time.  She comes here today at the request of her PCP, Dr. Sandi Mariscal, for evaluation regarding left-sided abdominal pain.  She says that this initially started back in June.  They treated her with Cipro 500 mg twice daily for 10 days and Flagyl 500 mg twice daily for 10 days.  She says that her pain resolved, but then shortly after her birthday in [birth year] the pain returned and has been present since that time.  As stated, she's been having pain again now for the past 3 to 4 weeks.  It is in the left side.  She says that it radiates into her back at times and hurts worse with coughing.  She describes it as a burning type pain at times.  She denies seeing blood in her stools.  She does have some constipation but takes MiraLAX daily.  She admits to some nausea which Phenergan helps with, but denies any vomiting.  Feels feverish intermittently, but no objective fever.   Past Medical History:  Diagnosis Date  . Anxiety   . Diverticulitis of colon 01/2011  . Hyperlipidemia   . RLS (restless legs syndrome)    Past Surgical History:  Procedure Laterality Date  . KNEE SURGERY  20 yrs ago   left    reports that she has been smoking cigarettes.  She has been smoking about 0.50 packs per day. She has never used smokeless tobacco. She reports that she drinks alcohol. She reports that she does not use drugs. family history includes Colon cancer in her maternal grandmother; Colon polyps in her maternal grandmother; Diabetes in her mother; Hypertension in her  sister; Multiple myeloma in her father. Allergies  Allergen Reactions  . Acetaminophen Swelling  . Aspirin Swelling  . Benadryl [Diphenhydramine Hcl] Swelling  . Citalopram   . Clonidine Derivatives   . Codeine Swelling  . Demerol Swelling  . Fluoxetine   . Hydrocodone   . Ibuprofen Swelling  . Lexapro [Escitalopram]   . Lipitor [Atorvastatin]   . Lovastatin   . Meperidine And Related   . Metronidazole   . Niaspan [Niacin]   . Penicillins Hives and Swelling  . Pravastatin   . Simvastatin   . Tramadol       Outpatient Encounter Medications as of 04/23/2018  Medication Sig  . ALPRAZolam (XANAX) 0.5 MG tablet Take 0.5 mg by mouth 2 (two) times daily as needed.    Marland Kitchen levothyroxine (SYNTHROID, LEVOTHROID) 50 MCG tablet Take 1 tablet by mouth daily.  Marland Kitchen morphine (MSIR) 15 MG tablet Take 1 tablet by mouth daily as needed.  . Polyethylene Glycol 3350 (MIRALAX PO) Take 17 g by mouth daily.  . promethazine (PHENERGAN) 25 MG tablet Take 1-2 tablets by mouth daily as needed.  . rosuvastatin (CRESTOR) 40 MG tablet Take 20 mg by mouth daily.  Marland Kitchen zolpidem (AMBIEN) 10 MG tablet Take 10 mg by mouth at bedtime as needed for sleep.  . [DISCONTINUED] rosuvastatin (CRESTOR) 10 MG tablet Take 10 mg by mouth daily with supper.  No facility-administered encounter medications on file as of 04/23/2018.      REVIEW OF SYSTEMS  : All other systems reviewed and negative except where noted in the History of Present Illness.   PHYSICAL EXAM: BP 110/62   Pulse 76   Ht 5' 3"  (1.6 m)   Wt 153 lb 12.8 oz (69.8 kg)   BMI 27.24 kg/m   General: Well developed white female in no acute distress Head: Normocephalic and atraumatic Eyes:  Sclerae anicteric, conjunctiva pink. Ears: Normal auditory acuity Lungs: Clear throughout to auscultation; no increased WOB.   Heart: Regular rate and rhythm; no M/R/G. Abdomen: Soft, non-distended.  Normal bowel sounds.  Mild to moderate left sided TTP. Musculoskeletal:  Symmetrical with no gross deformities  Skin: No lesions on visible extremities Extremities: No edema  Neurological: Alert oriented x 4, grossly non-focal Psychological:  Alert and cooperative. Normal mood and affect  ASSESSMENT AND PLAN: *LLQ abdominal pain:  History of diverticulitis that was quite significant in 2012.  Was recently treated with 10 days of cipro and flagyl by her PCP but pain has returned.  Will get a CT scan abdomen and pelvis with contrast this week if possible. *Personal history of colon polyps:  Last colonoscopy 04/2011 with adenomatous polyps removed.  Repeat was recommended in 5 years.  Will schedule with Dr. Fuller Plan as he is scheduling in to October anyway.   CC:  Derinda Late, MD

## 2018-04-23 NOTE — Patient Instructions (Addendum)
You have been scheduled for a CT scan of the abdomen and pelvis at [facility] ([address]---this is in the same building as [facility]).   You are scheduled on 04-24-18 at 2:30 pm. You should arrive 15 minutes prior to your appointment time for registration. Please follow the written instructions below on the day of your exam:  WARNING: IF YOU ARE ALLERGIC TO IODINE/X-RAY DYE, PLEASE NOTIFY RADIOLOGY IMMEDIATELY AT [phone]! YOU WILL BE GIVEN A 13 HOUR PREMEDICATION PREP.  1) Do not eat or drink anything after 11:30 am (4 hours prior to your test) 2) You have been given 2 bottles of oral contrast to drink. The solution may taste better if refrigerated, but do NOT add ice or any other liquid to this solution. Shake well before drinking.    Drink 1 bottle of contrast @ 12:30 pm (2 hours prior to your exam)  Drink 1 bottle of contrast @ 1:30 pm (1 hour prior to your exam)  You may take any medications as prescribed with a small amount of water except for the following: Metformin, Glucophage, Glucovance, Avandamet, Riomet, Fortamet, Actoplus Met, Janumet, Glumetza or Metaglip. The above medications must be held the day of the exam AND 48 hours after the exam.  The purpose of you drinking the oral contrast is to aid in the visualization of your intestinal tract. The contrast solution may cause some diarrhea. Before your exam is started, you will be given a small amount of fluid to drink. Depending on your individual set of symptoms, you may also receive an intravenous injection of x-ray contrast/dye. Plan on being at [facility] for 30 minutes or longer, depending on the type of exam you are having performed.  This test typically takes 30-45 minutes to complete.  If you have any questions regarding your exam or if you need to reschedule, you may call the CT department at [phone] between the hours of 8:00 am and 5:00 pm,  Monday-Friday.  ________________________________________________________________________  Beth Walters have been scheduled for a colonoscopy. Please follow written instructions given to you at your visit today.  Please pick up your prep supplies at the pharmacy within the next 1-3 days. If you use inhalers (even only as needed), please bring them with you on the day of your procedure. Your physician has requested that you go to www.startemmi.com and enter the access code given to you at your visit today. This web site gives a general overview about your procedure. However, you should still follow specific instructions given to you by our office regarding your preparation for the procedure.

## 2018-04-24 ENCOUNTER — Ambulatory Visit (INDEPENDENT_AMBULATORY_CARE_PROVIDER_SITE_OTHER)
Admission: RE | Admit: 2018-04-24 | Discharge: 2018-04-24 | Disposition: A | Discharge status: Home / Self Care | Payer: Medicaid Other | Source: Ambulatory Visit | Attending: Gastroenterology | Admitting: Gastroenterology

## 2018-04-24 DIAGNOSIS — R1032 Left lower quadrant pain: Secondary | ICD-10-CM

## 2018-04-24 MED ORDER — IOPAMIDOL (ISOVUE-300) INJECTION 61%
100.0000 mL | Freq: Once | INTRAVENOUS | Status: AC | PRN
  Administered 2018-04-24: 100 mL via INTRAVENOUS

## 2018-04-28 ENCOUNTER — Telehealth: Payer: Self-pay

## 2018-04-28 NOTE — Telephone Encounter (Signed)
Beth Walters the pt is calling for CT results

## 2018-04-28 NOTE — Progress Notes (Signed)
Reviewed and agree with management plan.  Marua Qin T. Ranata Laughery, MD FACG 

## 2018-05-02 ENCOUNTER — Telehealth: Payer: Self-pay | Admitting: Gastroenterology

## 2018-05-02 NOTE — Telephone Encounter (Signed)
Patient states she was referred to [facility] from here but was treated very badly there and has not received her results. Patient is wanting to be referred somewhere else for her kidney stone by our office. Please advise.

## 2018-05-05 NOTE — Telephone Encounter (Signed)
[facility] will be faxing over referral form

## 2018-05-05 NOTE — Telephone Encounter (Signed)
[place], [place] [address] Phone: [phone] Hours of Operation Mon-Fri 8:00 am - 5:00 pm  The pt has been advised we can schedule with [facility]

## 2018-05-05 NOTE — Telephone Encounter (Signed)
Referral made, pt aware she will call if not heard from [facility] in a few days.

## 2018-05-07 ENCOUNTER — Other Ambulatory Visit: Payer: Self-pay | Admitting: Urology

## 2018-05-12 ENCOUNTER — Encounter (HOSPITAL_COMMUNITY): Payer: Self-pay | Admitting: *Deleted

## 2018-05-14 NOTE — H&P (Signed)
I have kidney stones.  HPI: Beth Walters is a 57 year-old female patient who was referred by Dr. Marylene Land, MD who is here for renal calculi.  The problem is on the left side. She first stated noticing pain on 03/23/2018. She is currently having flank pain. She denies having back pain, groin pain, nausea, vomiting, fever, and chills.   Patient with left flank pain and she underwent CT scan March 23, 2018. This revealed a 8 mm left proximal stone in the renal pelvis or at the UPJ with a skin to stone distance of 10 cm, Hounsfield units of 1014 and it was visible on the scout image.   Today, she has left flank pain radiating to left side. She is staying hydrated and voiding well. She has constipation. No gross hematuria.   Of note, she's on chronic morphine 15 mg for her legs and gets #15 per month.     ALLERGIES: acetaminophen - Swelling Aspirin - Swelling Benadryl Clonidine Derivatives  codeine - Swelling Demerol - Swelling fluoxetine Hydrocodone Ibuprofen - Swelling Lexapro lipitor lovastatin Meperidine metronidazole Niaspan penicillin - Swelling, Hives pravastatin Tramadol    MEDICATIONS: Levothyroxine Sodium 50 mcg tablet  Alprazolam 0.5 mg tablet  Miralax  Morphine Sulfate 15 mg tablet  Promethazine Hcl 25 mg tablet  Rosuvastatin Calcium 40 mg tablet  Zolpidem Tartrate 10 mg tablet     GU PSH: None   NON-GU PSH: Leg surgery (unspecified), Left    GU PMH: None   NON-GU PMH: Anxiety Depression Diverticulitis Hypercholesterolemia    FAMILY HISTORY: Death of family member - Mother, Father   SOCIAL HISTORY: Marital Status: Single Preferred Language: English; Ethnicity: Not Hispanic Or Latino; Race: White Current Smoking Status: Patient smokes. Has smoked since 04/17/1985. Smokes 1 pack per day.   Tobacco Use Assessment Completed: Used Tobacco in last 30 days? Does not use smokeless tobacco. Drinks 2 drinks per month. Types of alcohol consumed: Wine.   Does not drink caffeine.    REVIEW OF SYSTEMS:    GU Review Female:   Patient reports frequent urination, burning /pain with urination, and get up at night to urinate. Patient denies hard to postpone urination, leakage of urine, stream starts and stops, trouble starting your stream, have to strain to urinate, and being pregnant.  Gastrointestinal (Upper):   Patient denies nausea, vomiting, and indigestion/ heartburn.  Gastrointestinal (Lower):   Patient reports constipation. Patient denies diarrhea.  Constitutional:   Patient reports fatigue. Patient denies fever, night sweats, and weight loss.  Skin:   Patient denies skin rash/ lesion and itching.  Eyes:   Patient denies blurred vision and double vision.  Ears/ Nose/ Throat:   Patient reports sinus problems. Patient denies sore throat.  Hematologic/Lymphatic:   Patient denies swollen glands and easy bruising.  Cardiovascular:   Patient denies leg swelling and chest pains.  Respiratory:   Patient denies shortness of breath and cough.  Endocrine:   Patient denies excessive thirst.  Musculoskeletal:   Patient reports back pain and joint pain.   Neurological:   Patient denies headaches and dizziness.  Psychologic:   Patient reports depression and anxiety.    VITAL SIGNS:      05/01/2018 09:35 AM  Weight 153 lb / 69.4 kg  Height 63 in / 160.02 cm  BP 161/79 mmHg  Pulse 75 /min  Temperature 97.1 F / 36.1 C  BMI 27.1 kg/m   MULTI-SYSTEM PHYSICAL EXAMINATION:    Constitutional: Well-nourished. No physical deformities. Normally developed.  Good grooming.  Neck: Neck symmetrical, not swollen. Normal tracheal position.  Respiratory: No labored breathing, no use of accessory muscles.   Cardiovascular: Normal temperature, normal extremity pulses, no swelling, no varicosities.  Skin: No paleness, no jaundice, no cyanosis. No lesion, no ulcer, no rash.  Neurologic / Psychiatric: Oriented to time, oriented to place, oriented to person. No  depression, no anxiety, no agitation.  Gastrointestinal: No mass, no tenderness, no rigidity, non obese abdomen.  Eyes: Normal conjunctivae. Normal eyelids.  Ears, Nose, Mouth, and Throat: Left ear no scars, no lesions, no masses. Right ear no scars, no lesions, no masses. Nose no scars, no lesions, no masses. Normal hearing. Normal lips.  Musculoskeletal: Normal gait and station of head and neck.     PAST DATA REVIEWED:  Source Of History:  Patient  X-Ray Review: C.T. Abdomen/Pelvis: Reviewed Films. 2019    PROCEDURES:         KUB - [id number]  A single view of the abdomen is obtained.  Fecal Stasis:  Moderate fecal stasis. Contrast noted in the sigmoid colon.  Calculi:  10 mm left renal pelvic stone      The bones appeared normal. The bowel gas pattern appeared normal. The soft tissues were unremarkable.         Urinalysis w/Scope Dipstick Dipstick Cont'd Micro  Color: Straw Bilirubin: Neg mg/dL WBC/hpf: 0 - 5/hpf  Appearance: Clear Ketones: Neg mg/dL RBC/hpf: 0 - 2/hpf  Specific Gravity: <=1.005 Blood: Trace ery/uL Bacteria: NS (Not Seen)  pH: 6.0 Protein: Neg mg/dL Cystals: NS (Not Seen)  Glucose: Neg mg/dL Urobilinogen: 0.2 mg/dL Casts: NS (Not Seen)    Nitrites: Neg Trichomonas: Not Present    Leukocyte Esterase: Neg leu/uL Mucous: Not Present      Epithelial Cells: 0 - 5/hpf      Yeast: NS (Not Seen)      Sperm: Not Present    ASSESSMENT:      ICD-10 Details  1 GU:   Renal calculus - N20.0    PLAN:           Orders X-Rays: KUB          Schedule Return Visit/Planned Activity: Next Available Appointment - Schedule Surgery          Document Letter(s):  Created for Patient: Clinical Summary         Notes:   Left renal stone-intubation a picture of the anatomy and we went over the nature risk and benefits of continued surveillance, ureteroscopy or shockwave lithotripsy. She is a good candidate for shockwave but with chronic morphine use and an allergy to Benadryl  she'll need MAC anesthesia. We'll also ask Dr. Sandi Mariscal if it is okay to give any postop pain medicine per the chronic pain protocol for PSC. UA clear. Discussed risk of bleeding and infection among others. Also she may need a staged procedure.    After a thorough review of the management options for the patient's condition the patient  elected to proceed with surgical therapy as noted above. We have discussed the potential benefits and risks of the procedure, side effects of the proposed treatment, the likelihood of the patient achieving the goals of the procedure, and any potential problems that might occur during the procedure or recuperation. Informed consent has been obtained.

## 2018-05-15 ENCOUNTER — Encounter (HOSPITAL_COMMUNITY)
Admission: RE | Disposition: A | Discharge status: Home / Self Care | Payer: Self-pay | Source: Ambulatory Visit | Attending: Urology

## 2018-05-15 ENCOUNTER — Ambulatory Visit (HOSPITAL_COMMUNITY)
Admission: RE | Admit: 2018-05-15 | Discharge: 2018-05-15 | Disposition: A | Discharge status: Home / Self Care | Payer: Medicaid Other | Source: Ambulatory Visit | Attending: Urology | Admitting: Urology

## 2018-05-15 ENCOUNTER — Encounter (HOSPITAL_COMMUNITY): Payer: Self-pay | Admitting: General Practice

## 2018-05-15 ENCOUNTER — Ambulatory Visit (HOSPITAL_COMMUNITY): Payer: Medicaid Other | Admitting: Anesthesiology

## 2018-05-15 ENCOUNTER — Ambulatory Visit (HOSPITAL_COMMUNITY): Payer: Medicaid Other

## 2018-05-15 DIAGNOSIS — E039 Hypothyroidism, unspecified: Secondary | ICD-10-CM | POA: Insufficient documentation

## 2018-05-15 DIAGNOSIS — F419 Anxiety disorder, unspecified: Secondary | ICD-10-CM | POA: Diagnosis not present

## 2018-05-15 DIAGNOSIS — F329 Major depressive disorder, single episode, unspecified: Secondary | ICD-10-CM | POA: Insufficient documentation

## 2018-05-15 DIAGNOSIS — E785 Hyperlipidemia, unspecified: Secondary | ICD-10-CM | POA: Diagnosis not present

## 2018-05-15 DIAGNOSIS — G8929 Other chronic pain: Secondary | ICD-10-CM | POA: Diagnosis not present

## 2018-05-15 DIAGNOSIS — N200 Calculus of kidney: Secondary | ICD-10-CM | POA: Diagnosis not present

## 2018-05-15 DIAGNOSIS — G2581 Restless legs syndrome: Secondary | ICD-10-CM | POA: Diagnosis not present

## 2018-05-15 DIAGNOSIS — F17200 Nicotine dependence, unspecified, uncomplicated: Secondary | ICD-10-CM | POA: Diagnosis not present

## 2018-05-15 HISTORY — DX: Personal history of urinary calculi: Z87.442

## 2018-05-15 HISTORY — DX: Other chronic pain: G89.29

## 2018-05-15 HISTORY — DX: Other specified postprocedural states: R11.2

## 2018-05-15 HISTORY — DX: Hypothyroidism, unspecified: E03.9

## 2018-05-15 HISTORY — DX: Major depressive disorder, single episode, unspecified: F32.9

## 2018-05-15 HISTORY — DX: Depression, unspecified: F32.A

## 2018-05-15 HISTORY — DX: Other specified postprocedural states: Z98.890

## 2018-05-15 HISTORY — DX: Unspecified osteoarthritis, unspecified site: M19.90

## 2018-05-15 HISTORY — PX: EXTRACORPOREAL SHOCK WAVE LITHOTRIPSY: SHX1557

## 2018-05-15 HISTORY — DX: Pain in leg, unspecified: M79.606

## 2018-05-15 SURGERY — LITHOTRIPSY, ESWL
Anesthesia: Monitor Anesthesia Care | Laterality: Left

## 2018-05-15 MED ORDER — FENTANYL CITRATE (PF) 100 MCG/2ML IJ SOLN: 25.0000 ug | INTRAMUSCULAR | Status: DC | PRN

## 2018-05-15 MED ORDER — FENTANYL CITRATE (PF) 100 MCG/2ML IJ SOLN
INTRAMUSCULAR | Status: DC | PRN
  Administered 2018-05-15: 25 ug via INTRAVENOUS
  Administered 2018-05-15: 50 ug via INTRAVENOUS
  Administered 2018-05-15: 25 ug via INTRAVENOUS

## 2018-05-15 MED ORDER — DIAZEPAM 5 MG PO TABS: 10.0000 mg | ORAL_TABLET | ORAL | Status: DC

## 2018-05-15 MED ORDER — DIPHENHYDRAMINE HCL 25 MG PO CAPS: 25.0000 mg | ORAL_CAPSULE | ORAL | Status: DC

## 2018-05-15 MED ORDER — MIDAZOLAM HCL 2 MG/2ML IJ SOLN
INTRAMUSCULAR | Status: AC
  Filled 2018-05-15: qty 2

## 2018-05-15 MED ORDER — PROPOFOL 500 MG/50ML IV EMUL
INTRAVENOUS | Status: DC | PRN
  Administered 2018-05-15: 50 ug/kg/min via INTRAVENOUS

## 2018-05-15 MED ORDER — PROPOFOL 10 MG/ML IV BOLUS
INTRAVENOUS | Status: AC
  Filled 2018-05-15: qty 40

## 2018-05-15 MED ORDER — ONDANSETRON HCL 4 MG/2ML IJ SOLN: 4.0000 mg | Freq: Once | INTRAMUSCULAR | Status: DC | PRN

## 2018-05-15 MED ORDER — PROPOFOL 500 MG/50ML IV EMUL
INTRAVENOUS | Status: DC | PRN
  Administered 2018-05-15: 20 mg via INTRAVENOUS

## 2018-05-15 MED ORDER — MIDAZOLAM HCL 5 MG/5ML IJ SOLN
INTRAMUSCULAR | Status: DC | PRN
  Administered 2018-05-15 (×2): 1 mg via INTRAVENOUS

## 2018-05-15 MED ORDER — FENTANYL CITRATE (PF) 100 MCG/2ML IJ SOLN
INTRAMUSCULAR | Status: AC
  Filled 2018-05-15: qty 2

## 2018-05-15 MED ORDER — SODIUM CHLORIDE 0.9 % IV SOLN
INTRAVENOUS | Status: DC
  Administered 2018-05-15: 07:00:00 via INTRAVENOUS

## 2018-05-15 MED ORDER — CIPROFLOXACIN HCL 500 MG PO TABS
500.0000 mg | ORAL_TABLET | ORAL | Status: AC
  Administered 2018-05-15: 500 mg via ORAL
  Filled 2018-05-15: qty 1

## 2018-05-15 MED ORDER — SODIUM CHLORIDE 0.9 % IV SOLN: INTRAVENOUS | Status: DC

## 2018-05-15 MED ORDER — HYDROMORPHONE HCL 1 MG/ML IJ SOLN: 0.2500 mg | INTRAMUSCULAR | Status: DC | PRN

## 2018-05-15 NOTE — Transfer of Care (Signed)
Immediate Anesthesia Transfer of Care Note  Patient: Beth Walters  Procedure(s) Performed: LEFT EXTRACORPOREAL SHOCK WAVE LITHOTRIPSY (ESWL) WITH MAC (Left )  Patient Location: PACU  Anesthesia Type:MAC  Level of Consciousness: awake, alert  and oriented  Airway & Oxygen Therapy: Patient Spontanous Breathing  Post-op Assessment: Report given to RN and Post -op Vital signs reviewed and stable  Post vital signs: Reviewed and stable  Last Vitals:  Vitals Value Taken Time  BP    Temp    Pulse    Resp    SpO2      Last Pain:  Vitals:   05/15/18 0631  TempSrc: Oral  PainSc: 8       Patients Stated Pain Goal: 6 (46/65/99 3570)  Complications: No apparent anesthesia complications

## 2018-05-15 NOTE — Interval H&P Note (Signed)
History and Physical Interval Note:  05/15/2018 7:13 AM  Beth Walters  has presented today for surgery, with the diagnosis of LEFT RENAL STONE  The various methods of treatment have been discussed with the patient and family. After consideration of risks, benefits and other options for treatment, the patient has consented to  Procedure(s): LEFT EXTRACORPOREAL SHOCK WAVE LITHOTRIPSY (ESWL) WITH MAC (Left) as a surgical intervention .  The patient's history has been reviewed, patient examined, no change in status, stable for surgery.  I have reviewed the patient's chart and labs.  Questions were answered to the patient's satisfaction.     [name]

## 2018-05-15 NOTE — Anesthesia Preprocedure Evaluation (Addendum)
Anesthesia Evaluation  Patient identified by MRN, date of birth, ID band Patient awake    Reviewed: Allergy & Precautions, NPO status , Patient's Chart, lab work & pertinent test results  History of Anesthesia Complications (+) PONV and history of anesthetic complications  Airway Mallampati: II  TM Distance: >3 FB Neck ROM: Full    Dental no notable dental hx.    Pulmonary Current Smoker,    Pulmonary exam normal breath sounds clear to auscultation       Cardiovascular negative cardio ROS Normal cardiovascular exam Rhythm:Regular Rate:Normal     Neuro/Psych PSYCHIATRIC DISORDERS Anxiety Depression negative neurological ROS     GI/Hepatic negative GI ROS, Neg liver ROS,   Endo/Other  Hypothyroidism   Renal/GU negative Renal ROS     Musculoskeletal RLS (restless legs syndrome) Chronic pain on narcotics   Abdominal   Peds  Hematology HLD   Anesthesia Other Findings LEFT RENAL STONE  Reproductive/Obstetrics                            Anesthesia Physical Anesthesia Plan  ASA: III  Anesthesia Plan: MAC   Post-op Pain Management:    Induction: Intravenous  PONV Risk Score and Plan: 2 and Propofol infusion and Treatment may vary due to age or medical condition  Airway Management Planned: Nasal Cannula  Additional Equipment:   Intra-op Plan:   Post-operative Plan:   Informed Consent: I have reviewed the patients History and Physical, chart, labs and discussed the procedure including the risks, benefits and alternatives for the proposed anesthesia with the patient or authorized representative who has indicated his/her understanding and acceptance.   Dental advisory given  Plan Discussed with: CRNA  Anesthesia Plan Comments:         Anesthesia Quick Evaluation

## 2018-05-15 NOTE — Op Note (Signed)
Preoperative diagnosis: Left renal stone Postoperative diagnosis: Left renal stone Surgery: Extracorporal lithotripsy left renal stone Surgeon: Dr. Nicki Reaper Mcdiarmid  The patient was prepped and draped in usual fashion.  Monitored anesthesia was utilized.  She received 4000 shocks to the 8 mm stone at the level of the left ureteropelvic junction.  She had excellent fragmentation.  Details of treatment are on the scanned document noted from the lithotripsy center

## 2018-05-16 NOTE — Anesthesia Postprocedure Evaluation (Signed)
Anesthesia Post Note  Patient: Beth Walters  Procedure(s) Performed: LEFT EXTRACORPOREAL SHOCK WAVE LITHOTRIPSY (ESWL) WITH MAC (Left )     Patient location during evaluation: PACU Anesthesia Type: MAC Level of consciousness: awake and alert Pain management: pain level controlled Vital Signs Assessment: post-procedure vital signs reviewed and stable Respiratory status: spontaneous breathing, nonlabored ventilation, respiratory function stable and patient connected to nasal cannula oxygen Cardiovascular status: stable and blood pressure returned to baseline Postop Assessment: no apparent nausea or vomiting Anesthetic complications: no    Last Vitals:  Vitals:   05/15/18 0901 05/15/18 0916  BP: (!) 153/84 (!) 150/80  Pulse: 64 (!) 59  Resp: 18 18  Temp:  (!) 36.3 C  SpO2: 100% 97%    Last Pain:  Vitals:   05/15/18 0853  TempSrc:   PainSc: 7                  Ryan P Ellender

## 2018-06-09 ENCOUNTER — Encounter (HOSPITAL_COMMUNITY): Payer: Self-pay | Admitting: Urology

## 2018-06-11 ENCOUNTER — Telehealth: Payer: Self-pay | Admitting: Gastroenterology

## 2018-06-11 MED ORDER — NA SULFATE-K SULFATE-MG SULF 17.5-3.13-1.6 GM/177ML PO SOLN: 1.0000 | ORAL | 0 refills | Status: DC

## 2018-06-11 NOTE — Telephone Encounter (Signed)
Rx sent to pharmacy   

## 2018-06-11 NOTE — Telephone Encounter (Signed)
Pt needs suprep sent to [facility] on [address].

## 2018-06-11 NOTE — Addendum Note (Signed)
Addended by: Wyline Beady on: 06/11/2018 11:16 AM   Modules accepted: Orders

## 2018-06-18 ENCOUNTER — Ambulatory Visit (AMBULATORY_SURGERY_CENTER): Payer: Medicaid Other | Admitting: Gastroenterology

## 2018-06-18 ENCOUNTER — Encounter: Payer: Self-pay | Admitting: Gastroenterology

## 2018-06-18 ENCOUNTER — Other Ambulatory Visit: Payer: Self-pay

## 2018-06-18 VITALS — BP 137/64 | HR 67 | Temp 98.4°F | Resp 11 | Ht 63.0 in | Wt 153.0 lb

## 2018-06-18 DIAGNOSIS — D122 Benign neoplasm of ascending colon: Secondary | ICD-10-CM

## 2018-06-18 DIAGNOSIS — D129 Benign neoplasm of anus and anal canal: Secondary | ICD-10-CM

## 2018-06-18 DIAGNOSIS — K621 Rectal polyp: Secondary | ICD-10-CM

## 2018-06-18 DIAGNOSIS — Z86010 Personal history of colonic polyps: Secondary | ICD-10-CM

## 2018-06-18 DIAGNOSIS — D128 Benign neoplasm of rectum: Secondary | ICD-10-CM

## 2018-06-18 MED ORDER — SODIUM CHLORIDE 0.9 % IV SOLN: 500.0000 mL | Freq: Once | INTRAVENOUS | Status: AC

## 2018-06-18 NOTE — Op Note (Signed)
[place] Patient Name: Beth Walters Procedure Date: 06/18/2018 3:16 PM MRN: [id number] Endoscopist: Ladene Artist , MD Age: 57 Referring MD:  Date of Birth: [birth year] Gender: Female Account #: [id number] Procedure:                Colonoscopy Indications:              Surveillance: Personal history of adenomatous                            polyps on last colonoscopy > 5 years ago Medicines:                Monitored Anesthesia Care Procedure:                Pre-Anesthesia Assessment:                           - Prior to the procedure, a History and Physical                            was performed, and patient medications and                            allergies were reviewed. The patient's tolerance of                            previous anesthesia was also reviewed. The risks                            and benefits of the procedure and the sedation                            options and risks were discussed with the patient.                            All questions were answered, and informed consent                            was obtained. Prior Anticoagulants: The patient has                            taken no previous anticoagulant or antiplatelet                            agents. ASA Grade Assessment: II - A patient with                            mild systemic disease. After reviewing the risks                            and benefits, the patient was deemed in                            satisfactory condition to undergo the procedure.  After obtaining informed consent, the colonoscope                            was passed under direct vision. Throughout the                            procedure, the patient's blood pressure, pulse, and                            oxygen saturations were monitored continuously. The                            Colonoscope was introduced through the anus and                            advanced to the the cecum,  identified by                            appendiceal orifice and ileocecal valve. The                            ileocecal valve, appendiceal orifice, and rectum                            were photographed. The quality of the bowel                            preparation was excellent. The colonoscopy was                            performed without difficulty. The patient tolerated                            the procedure well. Scope In: 3:20:14 PM Scope Out: 3:33:10 PM Scope Withdrawal Time: 0 hours 9 minutes 38 seconds  Total Procedure Duration: 0 hours 12 minutes 56 seconds  Findings:                 The perianal and digital rectal examinations were                            normal.                           Two sessile polyps were found in the rectum and                            ascending colon. The polyps were 6 to 7 mm in size.                            These polyps were removed with a cold snare.                            Resection and retrieval were complete.  Multiple medium-mouthed diverticula were found in                            the left colon. There was narrowing of the colon in                            association with the diverticular opening. There                            was evidence of diverticular spasm. There was no                            evidence of diverticular bleeding.                           Internal hemorrhoids were found during                            retroflexion. The hemorrhoids were small and Grade                            I (internal hemorrhoids that do not prolapse).                           The exam was otherwise without abnormality on                            direct and retroflexion views. Complications:            No immediate complications. Estimated blood loss:                            None. Estimated Blood Loss:     Estimated blood loss: none. Impression:               - Two 6 to 7 mm polyps in  the rectum and in the                            ascending colon, removed with a cold snare.                            Resected and retrieved.                           - Moderate diverticulosis in the left colon.                           - Internal hemorrhoids.                           - The examination was otherwise normal on direct                            and retroflexion views. Recommendation:           - Repeat colonoscopy in 5 years for surveillance.                           -  Patient has a contact number available for                            emergencies. The signs and symptoms of potential                            delayed complications were discussed with the                            patient. Return to normal activities tomorrow.                            Written discharge instructions were provided to the                            patient.                           - High fiber diet.                           - Continue present medications.                           - Await pathology results. Ladene Artist, MD 06/18/2018 3:36:38 PM This report has been signed electronically.

## 2018-06-18 NOTE — Patient Instructions (Signed)
YOU HAD AN ENDOSCOPIC PROCEDURE TODAY AT THE [facility]:   Refer to the procedure report that was given to you for any specific questions about what was found during the examination.  If the procedure report does not answer your questions, please call your gastroenterologist to clarify.  If you requested that your care partner not be given the details of your procedure findings, then the procedure report has been included in a sealed envelope for you to review at your convenience later.  YOU SHOULD EXPECT: Some feelings of bloating in the abdomen. Passage of more gas than usual.  Walking can help get rid of the air that was put into your GI tract during the procedure and reduce the bloating. If you had a lower endoscopy (such as a colonoscopy or flexible sigmoidoscopy) you may notice spotting of blood in your stool or on the toilet paper. If you underwent a bowel prep for your procedure, you may not have a normal bowel movement for a few days.  Please Note:  You might notice some irritation and congestion in your nose or some drainage.  This is from the oxygen used during your procedure.  There is no need for concern and it should clear up in a day or so.  SYMPTOMS TO REPORT IMMEDIATELY:   Following lower endoscopy (colonoscopy or flexible sigmoidoscopy):  Excessive amounts of blood in the stool  Significant tenderness or worsening of abdominal pains  Swelling of the abdomen that is new, acute  Fever of 100F or higher  For urgent or emergent issues, a gastroenterologist can be reached at any hour by calling [phone].   DIET:  We do recommend a small meal at first, but then you may proceed to your regular diet.  Drink plenty of fluids but you should avoid alcoholic beverages for 24 hours.  MEDICATIONS: Continue present medications.  Please see handouts given to you by your recovery nurse.  ACTIVITY:  You should plan to take it easy for the rest of today and you should NOT  DRIVE or use heavy machinery until tomorrow (because of the sedation medicines used during the test).    FOLLOW UP: Our staff will call the number listed on your records the next business day following your procedure to check on you and address any questions or concerns that you may have regarding the information given to you following your procedure. If we do not reach you, we will leave a message.  However, if you are feeling well and you are not experiencing any problems, there is no need to return our call.  We will assume that you have returned to your regular daily activities without incident.  If any biopsies were taken you will be contacted by phone or by letter within the next 1-3 weeks.  Please call us at [phone] if you have not heard about the biopsies in 3 weeks.   Thank you for allowing us to provide for your healthcare needs today.   SIGNATURES/CONFIDENTIALITY: You and/or your care partner have signed paperwork which will be entered into your electronic medical record.  These signatures attest to the fact that that the information above on your After Visit Summary has been reviewed and is understood.  Full responsibility of the confidentiality of this discharge information lies with you and/or your care-partner. 

## 2018-06-18 NOTE — Progress Notes (Signed)
Called to room to assist during endoscopic procedure.  Patient ID and intended procedure confirmed with present staff. Received instructions for my participation in the procedure from the performing physician.  

## 2018-06-18 NOTE — Progress Notes (Signed)
A and O x3. Report to RN. Tolerated MAC anesthesia well.

## 2018-06-19 ENCOUNTER — Telehealth: Payer: Self-pay | Admitting: *Deleted

## 2018-06-19 NOTE — Telephone Encounter (Signed)
  Follow up Call-  Call back number 06/18/2018  Post procedure Call Back phone  # [phone]  Permission to leave phone message Yes  Some recent data might be hidden     Patient questions:  Do you have a fever, pain , or abdominal swelling? No. Pain Score  0 *  Have you tolerated food without any problems? Yes.    Have you been able to return to your normal activities? Yes.    Do you have any questions about your discharge instructions: Diet   No. Medications  No. Follow up visit  No  Do you have questions or concerns about your Care? No.  Actions: * If pain score is 4 or above: No action needed, pain <4.

## 2018-07-07 ENCOUNTER — Encounter: Payer: Self-pay | Admitting: Gastroenterology

## 2018-07-22 ENCOUNTER — Ambulatory Visit: Payer: Medicaid Other | Admitting: Gastroenterology

## 2018-07-22 ENCOUNTER — Encounter: Payer: Self-pay | Admitting: Gastroenterology

## 2018-07-22 VITALS — BP 146/74 | HR 76 | Ht 63.0 in | Wt 151.0 lb

## 2018-07-22 DIAGNOSIS — R1032 Left lower quadrant pain: Secondary | ICD-10-CM | POA: Diagnosis not present

## 2018-07-22 DIAGNOSIS — R1031 Right lower quadrant pain: Secondary | ICD-10-CM

## 2018-07-22 DIAGNOSIS — K5730 Diverticulosis of large intestine without perforation or abscess without bleeding: Secondary | ICD-10-CM | POA: Diagnosis not present

## 2018-07-22 DIAGNOSIS — K5904 Chronic idiopathic constipation: Secondary | ICD-10-CM

## 2018-07-22 NOTE — Progress Notes (Signed)
    History of Present Illness: This is a 57 year old female complaining of sharp intermittent right and left groin pain.  She has been under the care of her urologist for a left kidney stone that has not been completely treated with lithotripsy.  Her constipation is well controlled on daily MiraLAX.  Recent colonoscopy and CT results below.  She has no active GI complaints except for intermittent mild nausea which does not appear to be related to meals or bowel movements..  Her intermittent bilateral groin pain is not related to bowel movement or meals.  Colonoscopy 06/2018 - Two 6 to 7 mm polyps in the rectum and in the ascending colon, removed with a cold snare. Resected and retrieved. - Moderate diverticulosis in the left colon. - Internal hemorrhoids. - The examination was otherwise normal on direct and retroflexion views.  Abdominal pelvic CT 04/25/2018 IMPRESSION: 1. 10 x 7 x 7 mm stone in the left renal pelvis with mild fullness of the intrarenal collecting system. There is peripelvic edema/inflammation suggesting irritation from the stone and associated infection could produce this appearance. 2. Small hiatal hernia. 3.  Aortic Atherosclerois (ICD10-170.0)  Current Medications, Allergies, Past Medical History, Past Surgical History, Family History and Social History were reviewed in [employer] record.  Physical Exam: General: Well developed, well nourished, no acute distress Head: Normocephalic and atraumatic Eyes:  sclerae anicteric, EOMI Ears: Normal auditory acuity Mouth: No deformity or lesions Lungs: Clear throughout to auscultation Heart: Regular rate and rhythm; no murmurs, rubs or bruits Abdomen: Soft, non tender and non distended. Mild tenderness in R and L groin. No masses, hepatosplenomegaly or hernias noted. Normal Bowel sounds Rectal: Not done Musculoskeletal: Symmetrical with no gross deformities  Pulses:  Normal pulses noted Extremities: No  clubbing, cyanosis, edema or deformities noted Neurological: Alert oriented x 4, grossly nonfocal Psychological:  Alert and cooperative. Anxiety.    Assessment and Recommendations:  1.  Right and left groin pain, intermittent. This is not GI related. Possible musculoskeletal or GYN. Further evaluation and follow up with her GYN and her PCP.   2.  Left sided kidney stone. Underwent ithotripsy by Urology with a portion of the stone still remaining.  Follow-up with Urology.  3.  Intermittent mild nausea.  Etiology unclear.  Possibly anxiety related.  Zofran 4 mg 3 times daily as needed.  Follow-up with PCP.  4.  Chronic constipation and diverticulosis.  High-fiber diet.  Adequate daily water intake.  Miralax daily. Follow up with PCP.   5.  Personal history of 1 sessile serrated polyp.  A 5-year interval surveillance colonoscopy is recommended in October 2024.   At this time the patient is released to her PCP, GYN and Urologist for ongoing care.

## 2018-07-22 NOTE — Patient Instructions (Signed)
Follow up with your primary care physician, urologist and OBGYN physician.   Thank you for choosing me and [facility].  Pricilla Riffle. Dagoberto Ligas., MD., Mar[facility] Regal

## 2019-08-22 IMAGING — CT CT ABD-PELV W/ CM
2 of 5 series · 15 of 46 positions shown, 17 images · IV contrast (iopamidol)
Comparison: 03/07/2011

CLINICAL DATA: Left lower quadrant pain with nausea and
intermittent fever. Decreased appetite for 6 weeks.

EXAM:
CT ABDOMEN AND PELVIS WITH CONTRAST
TECHNIQUE: Multidetector CT imaging of the abdomen and pelvis was performed
using the standard protocol following bolus administration of
intravenous contrast.
CONTRAST:  100mL [id number] IOPAMIDOL ([id number]) INJECTION 61%

[Series 2: abd/pel w · axial · 0.72mm/px · z∈[-473,-78]mm · 12 of 89 slices shown, 14 images]
[im 5/89  soft-tissue]
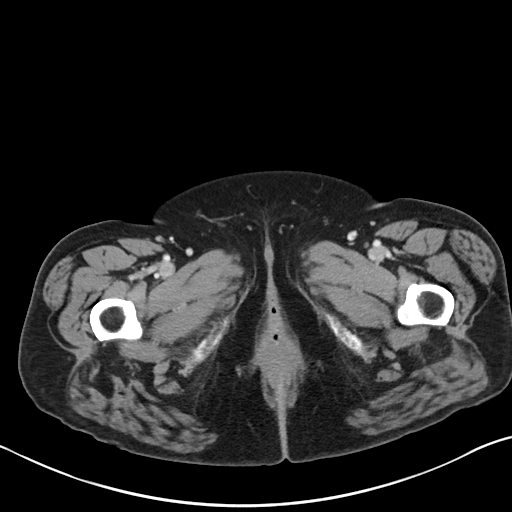
[im 5/89  bone]
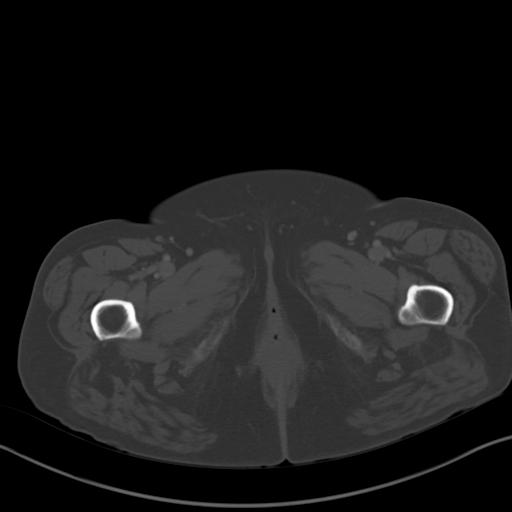
[im 14/89  soft-tissue]
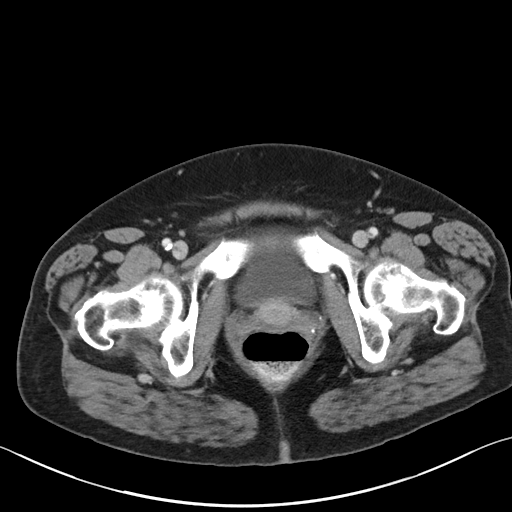
[im 18/89  soft-tissue]
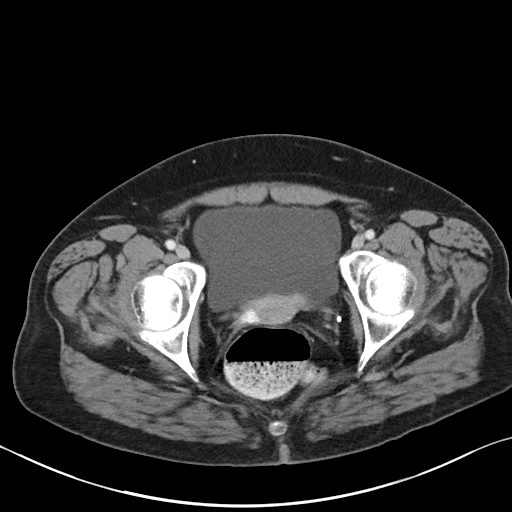
[im 27/89  soft-tissue]
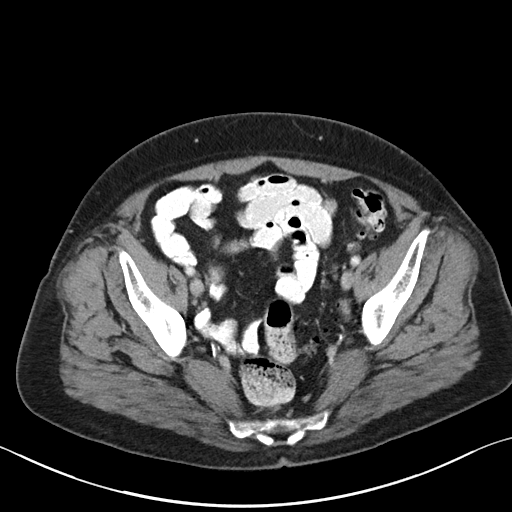
[im 36/89  soft-tissue]
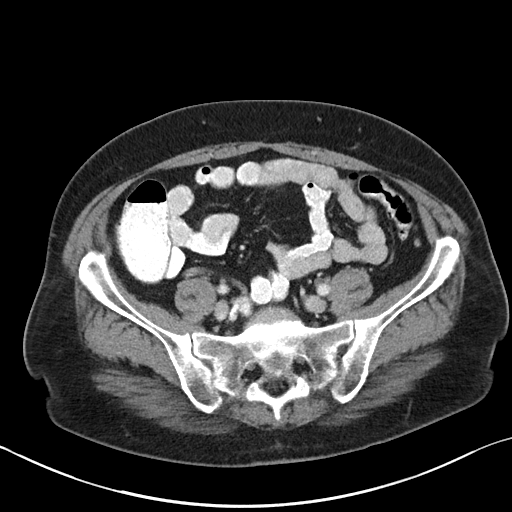
[im 40/89  soft-tissue]
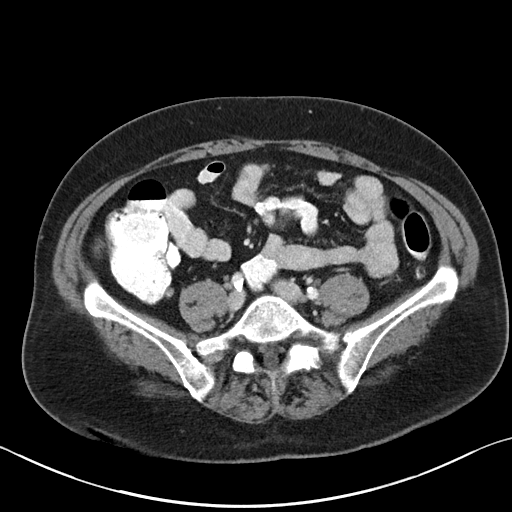
[im 49/89  soft-tissue]
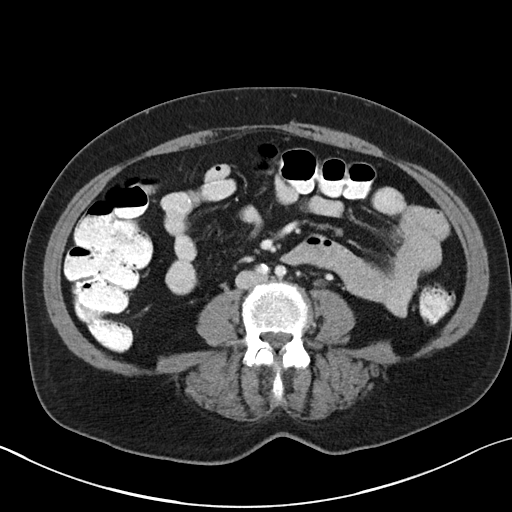
[im 53/89  soft-tissue]
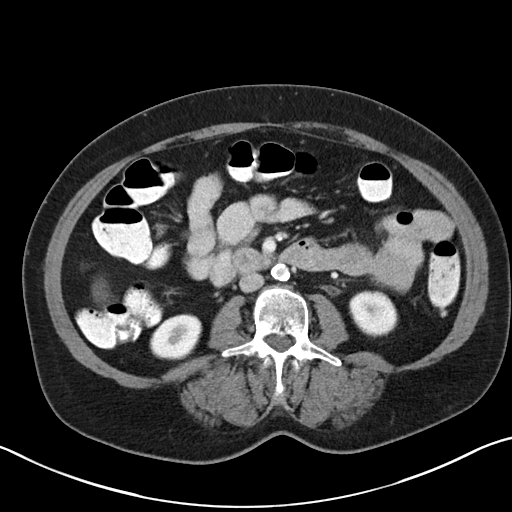
[im 62/89  soft-tissue]
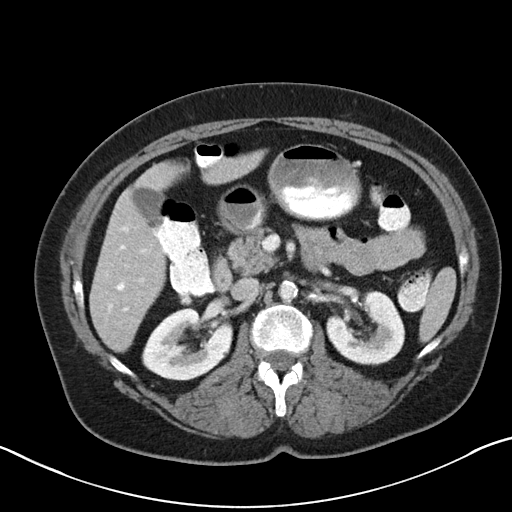
[im 62/89  bone]
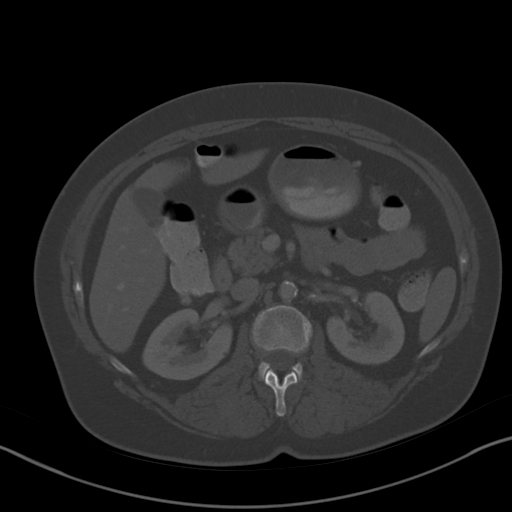
[im 71/89  soft-tissue]
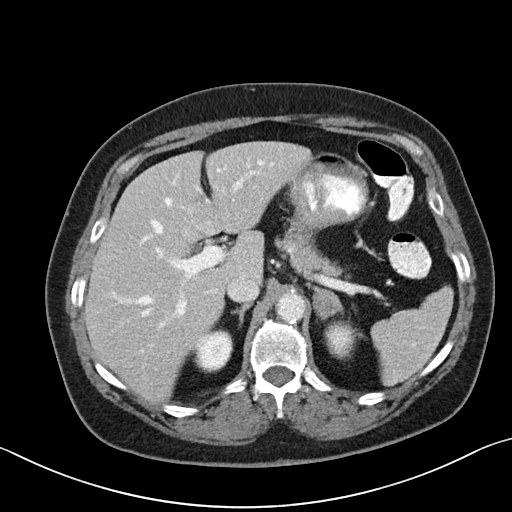
[im 75/89  soft-tissue]
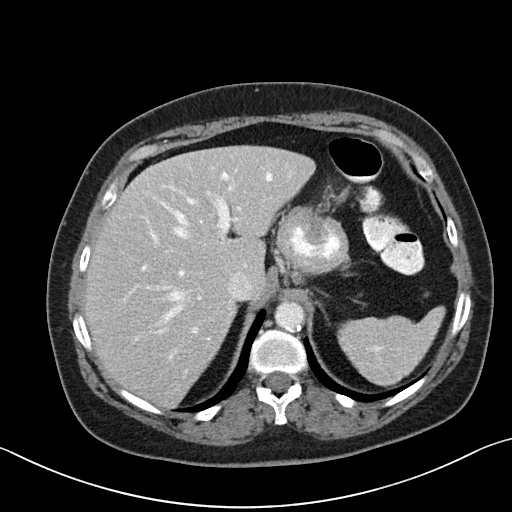
[im 84/89  soft-tissue]
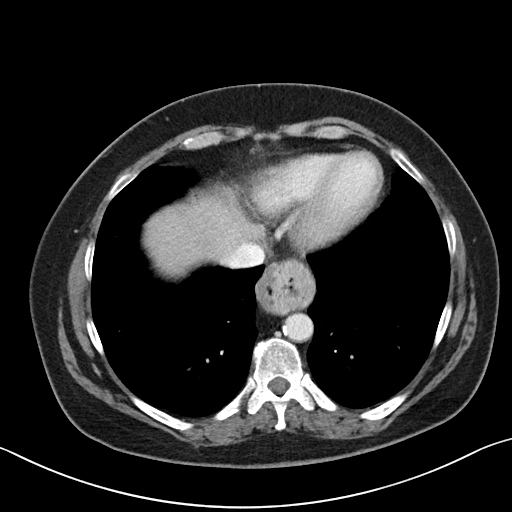

[Series 6: abd/pel w st · coronal · 0.64mm/px · 3 of 83 slices shown]
[im 28/83  soft-tissue]
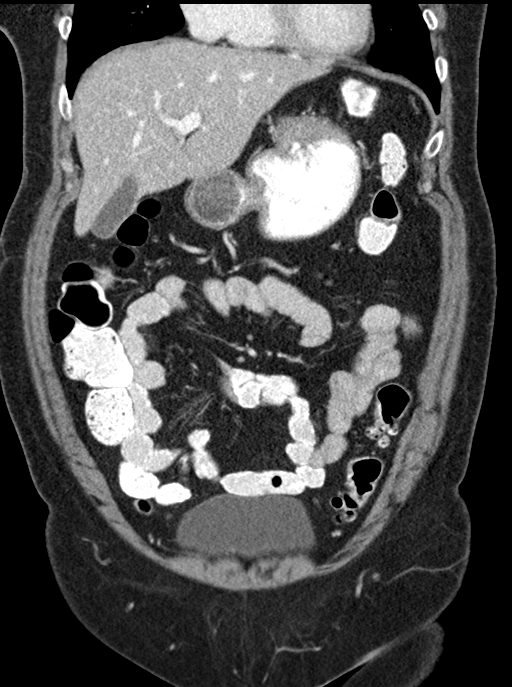
[im 37/83  soft-tissue]
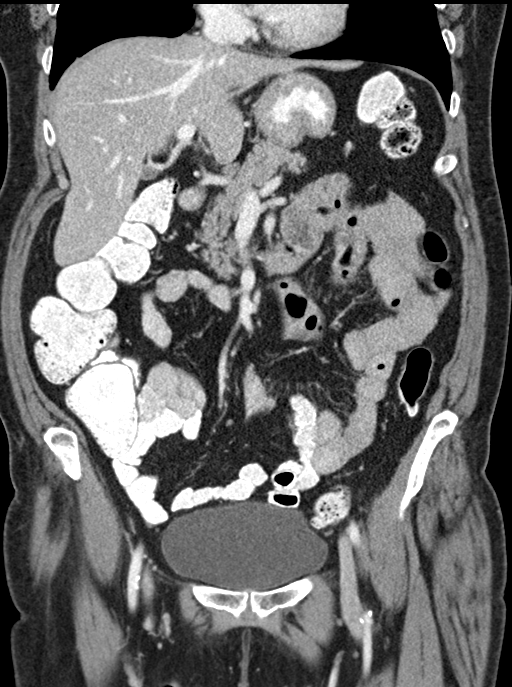
[im 46/83  soft-tissue]
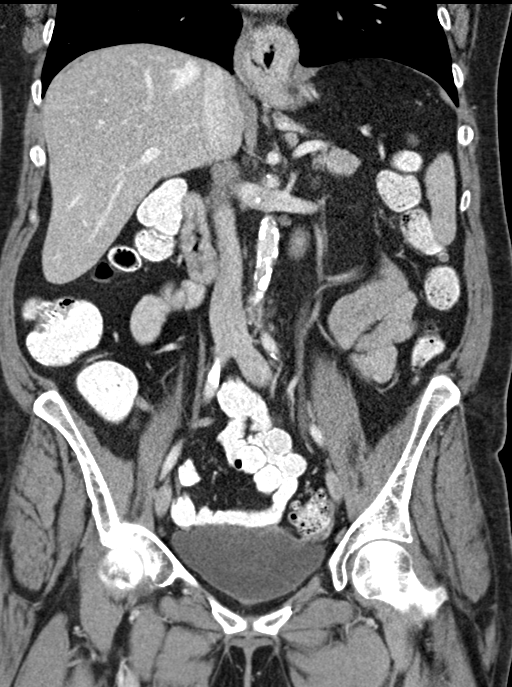

[15 of 46 positions shown; findings below may reference images not displayed]

FINDINGS: Lower chest: Small hiatal hernia.  Otherwise unremarkable.

Hepatobiliary: No focal abnormality within the liver parenchyma.
There is no evidence for gallstones, gallbladder wall thickening, or
pericholecystic fluid. No intrahepatic or extrahepatic biliary
dilation.

Pancreas: No focal mass lesion. No dilatation of the main duct. No
intraparenchymal cyst. No peripancreatic edema.

Spleen: No splenomegaly. No focal mass lesion.

Adrenals/Urinary Tract: Right adrenal gland unremarkable. 2.3 cm
left adrenal nodule stable since 03/07/2011 consistent with benign
etiology and likely adenoma. A second 12 mm nodule in the inferior
left adrenal gland is new since prior study and also likely a benign
finding such is adenoma. Right kidney unremarkable. 10 x 7 x 7 mm
stone identified in the left renal pelvis with mild fullness in the
left renal pelvis and proximal ureter. There is peripelvic
edema/inflammation. Ureters otherwise unremarkable. The urinary
bladder appears normal for the degree of distention.

Stomach/Bowel: Small hiatal hernia. Otherwise unremarkable stomach.
Duodenum is normally positioned as is the ligament of Treitz. No
small bowel wall thickening. No small bowel dilatation. The terminal
ileum is normal. The appendix is normal. Diverticular changes are
noted in the left colon without evidence of diverticulitis.

Vascular/Lymphatic: There is abdominal aortic atherosclerosis
without aneurysm. There is no gastrohepatic or hepatoduodenal
ligament lymphadenopathy. No intraperitoneal or retroperitoneal
lymphadenopathy. No pelvic sidewall lymphadenopathy.

Reproductive: There is no adnexal mass.

Other: No intraperitoneal free fluid.

Musculoskeletal: No worrisome lytic or sclerotic osseous
abnormality.
IMPRESSION: 1. 10 x 7 x 7 mm stone in the left renal pelvis with mild fullness
of the intrarenal collecting system. There is peripelvic
edema/inflammation suggesting irritation from the stone and
associated infection could produce this appearance.
2. Small hiatal hernia.
3.  Aortic Atherosclerois (VGMOI-170.0)

## 2019-09-12 IMAGING — CR DG ABDOMEN 1V
1 series · 1 of 1 positions shown · non-contrast
Comparison: 05/01/2018

CLINICAL DATA: Preoperative evaluation for lithotripsy

EXAM:
ABDOMEN - 1 VIEW

[t abdomen supine]
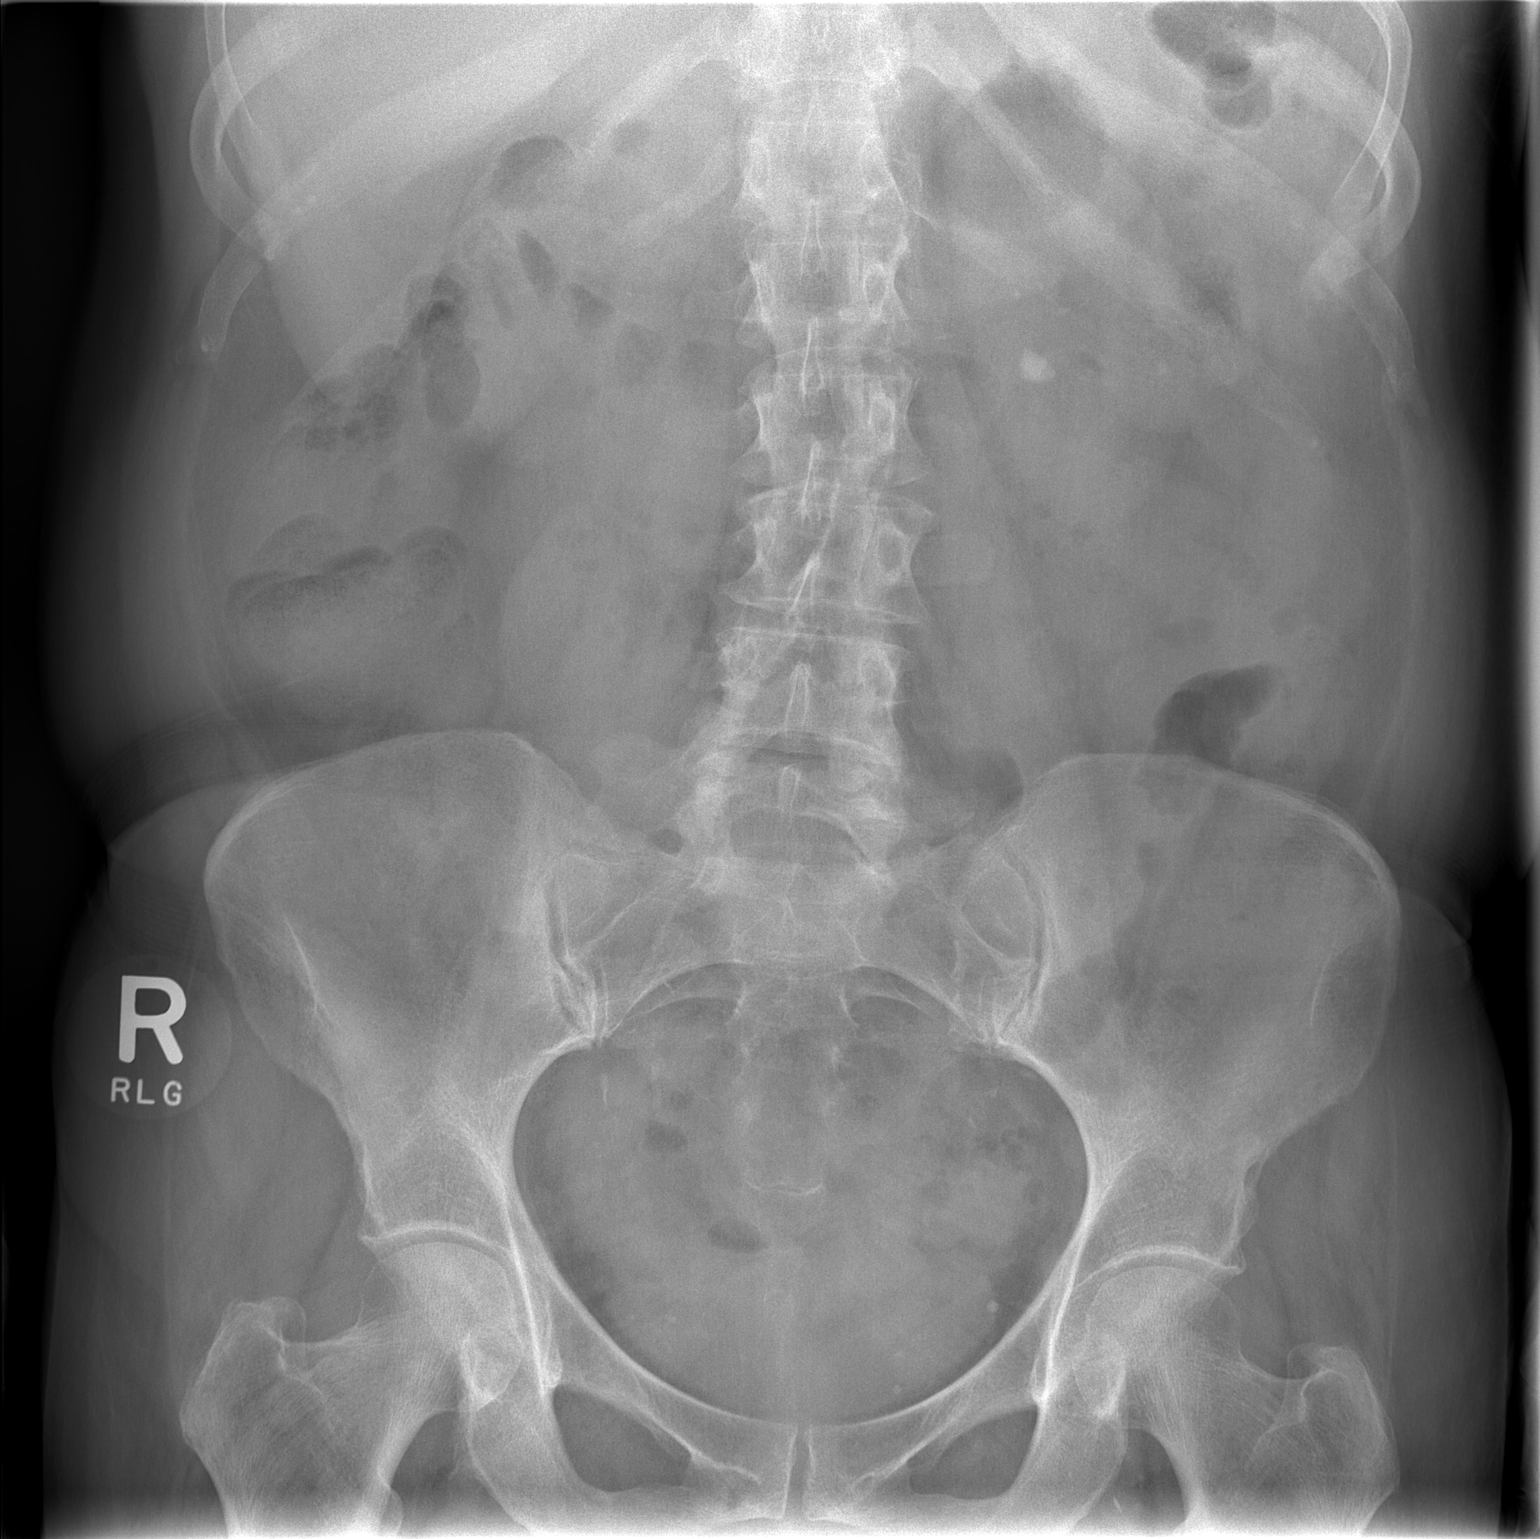

[1 of 1 positions shown; findings below may reference images not displayed]

FINDINGS: Scattered large and small bowel gas is noted. Dominant left renal
stone measuring approximately 12 mm is again identified. A few
smaller stones are noted. Previously seen contrast material in the
colon has passed. No definitive ureteral stones are seen. No bony
abnormality is noted.
IMPRESSION: Stable left renal stones when compared with the prior exam.

## 2021-06-13 ENCOUNTER — Ambulatory Visit: Payer: Medicaid Other | Admitting: Gastroenterology

## 2021-12-13 ENCOUNTER — Other Ambulatory Visit: Payer: Self-pay | Admitting: Family Medicine

## 2021-12-14 LAB — SARS CORONAVIRUS 2 (TAT 6-24 HRS): SARS Coronavirus 2: NEGATIVE

## 2022-06-18 ENCOUNTER — Encounter: Payer: Self-pay | Admitting: Emergency Medicine

## 2022-06-18 ENCOUNTER — Ambulatory Visit
Admission: EM | Admit: 2022-06-18 | Discharge: 2022-06-18 | Disposition: A | Discharge status: Home / Self Care | Payer: Medicaid Other | Attending: Internal Medicine | Admitting: Internal Medicine

## 2022-06-18 DIAGNOSIS — K0889 Other specified disorders of teeth and supporting structures: Secondary | ICD-10-CM | POA: Diagnosis not present

## 2022-06-18 DIAGNOSIS — R030 Elevated blood-pressure reading, without diagnosis of hypertension: Secondary | ICD-10-CM

## 2022-06-18 DIAGNOSIS — K047 Periapical abscess without sinus: Secondary | ICD-10-CM

## 2022-06-18 MED ORDER — CLINDAMYCIN HCL 150 MG PO CAPS: 450.0000 mg | ORAL_CAPSULE | Freq: Three times a day (TID) | ORAL | 0 refills | Status: AC

## 2022-06-18 NOTE — ED Provider Notes (Signed)
[facility]    CSN: [id number] Arrival date & time: 06/18/22  1038      History   Chief Complaint Chief Complaint  Patient presents with   Dental Pain    HPI Beth Walters is a 61 y.o. female.   Patient presents with left sided dental pain that has been present for a few days.  Denies trauma to the area.  Denies history of chronic dental pain.  Patient reports that she has taken already prescribed morphine with minimal improvement in pain.  Denies any associated fever, body aches, chills.  Patient also has elevated blood pressure reading and is attributing this to pain as she does not normally have elevated blood pressure.  Denies history of hypertension.  Denies chest pain, headache, shortness of breath, dizziness, blurred vision, nausea, vomiting.  Patient has not contacted a dentist as she reports that her dentist retired.   Dental Pain   Past Medical History:  Diagnosis Date   Allergy    Anxiety    Arthritis    Chronic leg pain    Depression    Diverticulitis of colon 01/2011   History of kidney stones    Hyperlipidemia    Hypothyroidism    PONV (postoperative nausea and vomiting)    with knee surgery- had to have stomach pumped   RLS (restless legs syndrome)     Patient Active Problem List   Diagnosis Date Noted   Left lower quadrant pain 04/23/2018   Nausea without vomiting 04/23/2018   History of diverticulitis 04/23/2018   History of colon polyps 04/23/2018    Past Surgical History:  Procedure Laterality Date   COLONOSCOPY     EXTRACORPOREAL SHOCK WAVE LITHOTRIPSY Left 05/15/2018   Procedure: LEFT EXTRACORPOREAL SHOCK WAVE LITHOTRIPSY (ESWL) WITH MAC;  Surgeon: Bjorn Loser, MD;  Location: [facility];  Service: Urology;  Laterality: Left;   KNEE SURGERY  20 yrs ago   left    OB History   No obstetric history on file.      Home Medications    Prior to Admission medications   Medication Sig Start Date End Date Taking? Authorizing  Provider  clindamycin (CLEOCIN) 150 MG capsule Take 3 capsules (450 mg total) by mouth 3 (three) times daily for 5 days. 06/18/22 06/23/22 Yes Anaia Frith, Michele Rockers, FNP  ALPRAZolam Duanne Moron) 0.5 MG tablet Take 0.5 mg by mouth 2 (two) times daily as needed.      [provider]  levothyroxine (SYNTHROID, LEVOTHROID) 50 MCG tablet Take 1 tablet by mouth daily. 03/26/18   [provider]  morphine (MSIR) 15 MG tablet Take 1 tablet by mouth daily as needed. 03/28/18   [provider]  Polyethylene Glycol 3350 (MIRALAX PO) Take 17 g by mouth daily.    [provider]  promethazine (PHENERGAN) 25 MG tablet Take 1-2 tablets by mouth daily as needed. 03/28/18   [provider]  rosuvastatin (CRESTOR) 40 MG tablet Take 20 mg by mouth daily.    [provider]  zolpidem (AMBIEN) 10 MG tablet Take 10 mg by mouth at bedtime as needed for sleep.    [provider]    Family History Family History  Problem Relation Age of Onset   Diabetes Mother    Multiple myeloma Father    Colon polyps Maternal Grandmother    Colon cancer Maternal Grandmother    Hypertension Sister    Esophageal cancer Neg Hx    Stomach cancer Neg Hx  Rectal cancer Neg Hx     Social History Social History   Tobacco Use   Smoking status: Every Day    Packs/day: 1.00    Types: Cigarettes   Smokeless tobacco: Never  Vaping Use   Vaping Use: Never used  Substance Use Topics   Alcohol use: Yes    Comment: occasional   Drug use: No     Allergies   Acetaminophen, Aspirin, Benadryl [diphenhydramine hcl], Citalopram, Clonidine derivatives, Codeine, Demerol, Fluoxetine, Hydrocodone, Ibuprofen, Lexapro [escitalopram], Lipitor [atorvastatin], Lovastatin, Meperidine and related, Metronidazole, Niaspan [niacin], Penicillins, Pravastatin, Simvastatin, and Tramadol   Review of Systems Review of Systems Per HPI  Physical Exam Triage Vital Signs ED Triage Vitals  Enc Vitals  Group     BP 06/18/22 1053 (!) 172/100     Pulse Rate 06/18/22 1053 82     Resp 06/18/22 1053 18     Temp 06/18/22 1053 98.4 F (36.9 C)     Temp src --      SpO2 06/18/22 1053 96 %     Weight --      Height --      Head Circumference --      Peak Flow --      Pain Score 06/18/22 1052 10     Pain Loc --      Pain Edu? --      Excl. in [place]? --    No data found.  Updated Vital Signs BP (!) 172/100   Pulse 82   Temp 98.4 F (36.9 C)   Resp 18   LMP 03/19/2011   SpO2 96%   Visual Acuity Right Eye Distance:   Left Eye Distance:   Bilateral Distance:    Right Eye Near:   Left Eye Near:    Bilateral Near:     Physical Exam Constitutional:      General: She is not in acute distress.    Appearance: Normal appearance. She is not toxic-appearing or diaphoretic.  HENT:     Head: Normocephalic and atraumatic.     Mouth/Throat:     Lips: Pink.     Mouth: Mucous membranes are moist.     Dentition: Dental tenderness and gingival swelling present.     Comments: Gingival swelling and erythema located to left lower back dentition. Eyes:     Extraocular Movements: Extraocular movements intact.     Conjunctiva/sclera: Conjunctivae normal.     Pupils: Pupils are equal, round, and reactive to light.  Cardiovascular:     Rate and Rhythm: Normal rate and regular rhythm.     Pulses: Normal pulses.     Heart sounds: Normal heart sounds.  Pulmonary:     Effort: Pulmonary effort is normal. No respiratory distress.     Breath sounds: Normal breath sounds.  Neurological:     General: No focal deficit present.     Mental Status: She is alert and oriented to person, place, and time. Mental status is at baseline.     Cranial Nerves: Cranial nerves 2-12 are intact.     Sensory: Sensation is intact.     Motor: Motor function is intact.     Coordination: Coordination is intact.     Gait: Gait is intact.  Psychiatric:        Mood and Affect: Mood normal.        Behavior: Behavior  normal.        Thought Content: Thought content normal.  Judgment: Judgment normal.      UC Treatments / Results  Labs (all labs ordered are listed, but only abnormal results are displayed) Labs Reviewed - No data to display  EKG   Radiology No results found.  Procedures Procedures (including critical care time)  Medications Ordered in UC Medications - No data to display  Initial Impression / Assessment and Plan / UC Course  I have reviewed the triage vital signs and the nursing notes.  Pertinent labs & imaging results that were available during my care of the patient were reviewed by me and considered in my medical decision making (see chart for details).     Patient has dental infection.  Given penicillin allergy, will treat with clindamycin.  Patient advised to take this medication with food.  Patient advised to follow-up with dentist for further evaluation and management.  Patient provided with paperwork for dental resources as well.  Patient has elevated blood pressure reading but this is most likely due to pain as patient does not have a history of elevated blood pressure reading and does not take any daily medications.  No signs of hypertensive urgency on exam as there is no signs of endorgan damage and neuro exam is normal.  Patient has home blood pressure cuff and was encouraged to monitor this at home.  Advised strict follow-up and ER precautions if blood pressure remains elevated.  Patient already has prescribed morphine for pain.  Patient verbalized understanding and was agreeable with plan. Final Clinical Impressions(s) / UC Diagnoses   Final diagnoses:  Dental infection  Pain, dental  Elevated blood pressure reading     Discharge Instructions      You have been prescribed an antibiotic to treat dental infection.  Please take this medication with food.  Follow-up with dentist for further evaluation and management.  Monitor your blood pressure at home  once your pain is controlled.    ED Prescriptions     Medication Sig Dispense Auth. Provider   clindamycin (CLEOCIN) 150 MG capsule Take 3 capsules (450 mg total) by mouth 3 (three) times daily for 5 days. 45 capsule [place], Michele Rockers, [place]      PDMP not reviewed this encounter.   Teodora Medici, [place] 06/18/22 1146

## 2022-06-18 NOTE — Discharge Instructions (Signed)
You have been prescribed an antibiotic to treat dental infection.  Please take this medication with food.  Follow-up with dentist for further evaluation and management.  Monitor your blood pressure at home once your pain is controlled.

## 2022-06-18 NOTE — ED Triage Notes (Signed)
Pt is present today with left side dental pain. Pt states that she noticed the pain Friday

## 2023-06-26 ENCOUNTER — Encounter: Payer: Self-pay | Admitting: Gastroenterology

## 2023-07-11 ENCOUNTER — Encounter: Payer: Self-pay | Admitting: Gastroenterology

## 2023-07-24 ENCOUNTER — Ambulatory Visit (AMBULATORY_SURGERY_CENTER): Payer: Medicaid Other | Admitting: *Deleted

## 2023-07-24 VITALS — Ht 63.0 in | Wt 160.0 lb

## 2023-07-24 DIAGNOSIS — Z860100 Personal history of colon polyps, unspecified: Secondary | ICD-10-CM

## 2023-07-24 MED ORDER — PEG 3350-KCL-NA BICARB-NACL 420 G PO SOLR: 4000.0000 mL | Freq: Once | ORAL | 0 refills | Status: AC

## 2023-07-24 NOTE — Progress Notes (Signed)
Pt's name and DOB verified at the beginning of the pre-visit wit 2 identifiers  Pt denies any difficulty with ambulating,sitting, laying down or rolling side to side  Gave both [facility] main # and MD on call # prior to instructions.   No egg or soy allergy known to patient   Pt has hx of PONV  Pt denies having issues being intubated  Pt has no issues moving head neck or swallowing  No FH of Malignant Hyperthermia  Pt is not on diet pills or shots  Pt is not on home 02   Pt is not on blood thinners   Pt has frequent issues with constipation pt uses Miralax every day  Pt is not on dialysis  Pt denise any abnormal heart rhythms   Pt denies any upcoming cardiac testing  Pt encouraged to use to use Singlecare or Goodrx to reduce cost   Patient's chart reviewed by Cathlyn Parsons CNRA prior to pre-visit and patient appropriate for the [facility].  Pre-visit completed and red dot placed by patient's name on their procedure day (on provider's schedule).  .  Visit by phone  Pt states weight is 160 lb  Instructed pt why it is important to and  to call if they have any changes in health or new medications. Directed them to the # given and on instructions.     Instructions reviewed with pt and pt states understanding. Instructed to review again prior to procedure. Pt states they will.   Instructions sent by mail

## 2023-08-16 ENCOUNTER — Encounter (HOSPITAL_COMMUNITY): Payer: Self-pay

## 2023-08-16 ENCOUNTER — Other Ambulatory Visit: Payer: Self-pay

## 2023-08-16 ENCOUNTER — Emergency Department (HOSPITAL_COMMUNITY)
Admission: EM | Admit: 2023-08-16 | Discharge: 2023-08-16 | Disposition: A | Discharge status: Home / Self Care | Payer: Medicaid Other | Attending: Emergency Medicine | Admitting: Emergency Medicine

## 2023-08-16 ENCOUNTER — Emergency Department (HOSPITAL_COMMUNITY): Payer: Medicaid Other

## 2023-08-16 DIAGNOSIS — N3091 Cystitis, unspecified with hematuria: Secondary | ICD-10-CM | POA: Insufficient documentation

## 2023-08-16 DIAGNOSIS — N3090 Cystitis, unspecified without hematuria: Secondary | ICD-10-CM

## 2023-08-16 DIAGNOSIS — R300 Dysuria: Secondary | ICD-10-CM | POA: Diagnosis present

## 2023-08-16 LAB — URINALYSIS, ROUTINE W REFLEX MICROSCOPIC
Bilirubin Urine: NEGATIVE
Glucose, UA: NEGATIVE mg/dL
Ketones, ur: NEGATIVE mg/dL
Nitrite: POSITIVE — AB
Protein, ur: 30 mg/dL — AB
Specific Gravity, Urine: 1.004 — ABNORMAL LOW (ref 1.005–1.030)
WBC, UA: >50 WBC/hpf (ref 0–5)
pH: 6 (ref 5.0–8.0)

## 2023-08-16 LAB — URINALYSIS, W/ REFLEX TO CULTURE (INFECTION SUSPECTED)
Bilirubin Urine: NEGATIVE
Glucose, UA: NEGATIVE mg/dL
Ketones, ur: NEGATIVE mg/dL
Nitrite: POSITIVE — AB
Protein, ur: 30 mg/dL — AB
Specific Gravity, Urine: 1.003 — ABNORMAL LOW (ref 1.005–1.030)
WBC, UA: >50 WBC/hpf (ref 0–5)
pH: 6 (ref 5.0–8.0)

## 2023-08-16 MED ORDER — CEFDINIR 300 MG PO CAPS: 300.0000 mg | ORAL_CAPSULE | Freq: Two times a day (BID) | ORAL | 0 refills | Status: AC

## 2023-08-16 NOTE — Discharge Instructions (Addendum)
You have been seen today for your complaint of pain with urination. Your lab work was consistent with a urinary tract infection. Your imaging showed a bladder infection. Your discharge medications include Keflex. This is an antibiotic. You should take it as prescribed. You should take it for the entire duration of the prescription. This may cause an upset stomach. This is normal. You may take this with food. You may also eat yogurt to prevent diarrhea. Follow up with: Your primary urologist in 1 week for reevaluation Please seek immediate medical care if you develop any of the following symptoms: Your symptoms don't get better after 1-2 days of taking antibiotics. Your symptoms go away and then come back. You have a fever or chills. You vomit or feel like you may vomit. At this time there does not appear to be the presence of an emergent medical condition, however there is always the potential for conditions to change. Please read and follow the below instructions.  Do not take your medicine if  develop an itchy rash, swelling in your mouth or lips, or difficulty breathing; call 911 and seek immediate emergency medical attention if this occurs.  You may review your lab tests and imaging results in their entirety on your MyChart account.  Please discuss all results of fully with your primary care provider and other specialist at your follow-up visit.  Note: Portions of this text may have been transcribed using voice recognition software. Every effort was made to ensure accuracy; however, inadvertent computerized transcription errors may still be present.

## 2023-08-16 NOTE — ED Provider Notes (Signed)
[place] EMERGENCY DEPARTMENT AT [facility] Provider Note   CSN: [id number] Arrival date & time: 08/16/23  1339     History  Chief Complaint  Patient presents with   Dysuria    Beth Walters is a 62 y.o. female.  With a history of restless leg syndrome, hyperlipidemia, anxiety, depression, arthritis presenting for evaluation of pain and burning with urination.  States this has been going on for the past month or so.  Has a history of kidney stones.  She reports she had a pessary removed by her urologist 6 weeks ago and was started on oxybutynin at that time.  Her symptoms began 2 weeks later.  Does have a history of uterine prolapse.  She has discussed definitive management for this with urology such as hysterectomy but states that she feels too old to undergo this procedure at this time.  Pain is described as a throbbing sensation during urination and immediately afterwards.  This quickly goes away.  She denies any lower abdominal pain, back pain or flank pain.  No fevers or chills.  No nausea or vomiting.  No bowel abnormalities.  She states she feels more dry lately due to her new medication.  She was on Uribel prior to this and reports that this worked very well for her but she has recently ran out.   Dysuria      Home Medications Prior to Admission medications   Medication Sig Start Date End Date Taking? Authorizing Provider  cefdinir (OMNICEF) 300 MG capsule Take 1 capsule (300 mg total) by mouth 2 (two) times daily for 7 days. 08/16/23 08/23/23 Yes Vasil Juhasz, Edsel Petrin, PA-C  ALPRAZolam Prudy Feeler) 0.5 MG tablet Take 0.5 mg by mouth 2 (two) times daily as needed.      [provider]  hydrocortisone 2.5 % ointment SMARTSIG:Sparingly Topical Twice Daily 06/28/23   [provider]  levothyroxine (SYNTHROID, LEVOTHROID) 50 MCG tablet Take 1 tablet by mouth daily. 03/26/18   [provider]  morphine (MSIR) 15 MG tablet Take 1 tablet by mouth daily  as needed. 03/28/18   [provider]  Polyethylene Glycol 3350 (MIRALAX PO) Take 17 g by mouth daily.    [provider]  promethazine (PHENERGAN) 25 MG tablet Take 1-2 tablets by mouth daily as needed. Patient not taking: Reported on 07/24/2023 03/28/18   [provider]  rosuvastatin (CRESTOR) 40 MG tablet Take 20 mg by mouth daily. 20 mg every 3rd day    [provider]  valACYclovir (VALTREX) 1000 MG tablet Take 1,000 mg by mouth 2 (two) times daily. 07/03/23   [provider]  zolpidem (AMBIEN) 10 MG tablet Take 10 mg by mouth at bedtime as needed for sleep.    [provider]      Allergies    Bee venom, Acetaminophen, Aspirin, Benadryl [diphenhydramine hcl], Citalopram, Clonidine derivatives, Codeine, Demerol, Fluoxetine, Hydrocodone, Ibuprofen, Lexapro [escitalopram], Lipitor [atorvastatin], Lovastatin, Meperidine and related, Metronidazole, Niaspan [niacin], Penicillins, Pravastatin, Simvastatin, and Tramadol    Review of Systems   Review of Systems  Genitourinary:  Positive for dysuria.  All other systems reviewed and are negative.   Physical Exam Updated Vital Signs BP 120/69 (BP Location: Left Arm)   Pulse 81   Temp 98.3 F (36.8 C) (Oral)   Resp 16   Ht 5\' 4"  (1.626 m)   Wt 72.6 kg   LMP 03/19/2011   SpO2 93%   BMI 27.46 kg/m  Physical Exam Vitals and  nursing note reviewed.  Constitutional:      General: She is not in acute distress.    Appearance: She is well-developed.  HENT:     Head: Normocephalic and atraumatic.  Eyes:     Conjunctiva/sclera: Conjunctivae normal.  Cardiovascular:     Rate and Rhythm: Normal rate and regular rhythm.     Heart sounds: No murmur heard. Pulmonary:     Effort: Pulmonary effort is normal. No respiratory distress.     Breath sounds: Normal breath sounds.  Abdominal:     Palpations: Abdomen is soft.     Tenderness: There is no abdominal tenderness. There is no right CVA  tenderness, left CVA tenderness or guarding.  Genitourinary:    Comments: GU exam declined Musculoskeletal:        General: No swelling.     Cervical back: Neck supple.  Skin:    General: Skin is warm and dry.     Capillary Refill: Capillary refill takes less than 2 seconds.  Neurological:     Mental Status: She is alert.  Psychiatric:        Mood and Affect: Mood normal.     ED Results / Procedures / Treatments   Labs (all labs ordered are listed, but only abnormal results are displayed) Labs Reviewed  URINALYSIS, ROUTINE W REFLEX MICROSCOPIC - Abnormal; Notable for the following components:      Result Value   APPearance TURBID (*)    Specific Gravity, Urine 1.004 (*)    Hgb urine dipstick SMALL (*)    Protein, ur 30 (*)    Nitrite POSITIVE (*)    Leukocytes,Ua LARGE (*)    Bacteria, UA MANY (*)    All other components within normal limits  URINALYSIS, W/ REFLEX TO CULTURE (INFECTION SUSPECTED) - Abnormal; Notable for the following components:   APPearance CLOUDY (*)    Specific Gravity, Urine 1.003 (*)    Hgb urine dipstick SMALL (*)    Protein, ur 30 (*)    Nitrite POSITIVE (*)    Leukocytes,Ua LARGE (*)    Bacteria, UA MANY (*)    Non Squamous Epithelial 0-5 (*)    All other components within normal limits  URINE CULTURE    EKG None  Radiology CT Renal Stone Study  Result Date: 08/16/2023 CLINICAL DATA:  Dysuria. EXAM: CT ABDOMEN AND PELVIS WITHOUT CONTRAST TECHNIQUE: Multidetector CT imaging of the abdomen and pelvis was performed following the standard protocol without IV contrast. RADIATION DOSE REDUCTION: This exam was performed according to the departmental dose-optimization program which includes automated exposure control, adjustment of the mA and/or kV according to patient size and/or use of iterative reconstruction technique. COMPARISON:  April 24, 2018. FINDINGS: Lower chest: Visualized lung bases are unremarkable. Moderate size sliding-type hiatal  hernia. Hepatobiliary: No focal liver abnormality is seen. No gallstones, gallbladder wall thickening, or biliary dilatation. Pancreas: Unremarkable. No pancreatic ductal dilatation or surrounding inflammatory changes. Spleen: Normal in size without focal abnormality. Adrenals/Urinary Tract: Stable left adrenal adenoma. Right adrenal gland appears normal. Nonobstructive left nephrolithiasis is noted with the largest calculus measuring 5 mm. No hydronephrosis or renal obstruction is noted. Mild urinary bladder wall thickening is noted which may represent lack of distension, but cystitis cannot be excluded. Stomach/Bowel: There is no evidence of bowel obstruction or inflammation. The appendix appears normal. Sigmoid diverticulosis without inflammation. Vascular/Lymphatic: Aortic atherosclerosis. No enlarged abdominal or pelvic lymph nodes. Reproductive: Status post hysterectomy. No adnexal masses. Other: No abdominal wall hernia or abnormality.  No abdominopelvic ascites. Musculoskeletal: No acute or significant osseous findings. IMPRESSION: Mild uniform wall thickening of urinary bladder is noted which may represent lack of distension, but cystitis cannot be excluded. Nonobstructive left nephrolithiasis. Stable left adrenal adenoma. Sigmoid diverticulosis without inflammation. Moderate size hiatal hernia. Aortic Atherosclerosis (ICD10-I70.0). Electronically Signed   By: Lupita Raider M.D.   On: 08/16/2023 15:34    Procedures Procedures    Medications Ordered in ED Medications - No data to display  ED Course/ Medical Decision Making/ A&P                                 Medical Decision Making Amount and/or Complexity of Data Reviewed Labs: ordered.  This patient presents to the ED for concern of dysuria, this involves an extensive number of treatment options, and is a complaint that carries with it a high risk of complications and morbidity.  Differential diagnosis includes cystitis, bladder neck  obstruction, medication side effect, ureterolithiasis/nephrolithiasis  My initial workup includes urinalysis, CT stone study  Additional history obtained from: Nursing notes from this visit.  I ordered, reviewed and interpreted labs which include: Urinalysis  I ordered imaging studies including CT stone study I independently visualized and interpreted imaging which showed mild uniform wall thickening of urinary bladder may represent lack of distention versus cystitis.  Nonobstructive left nephrolithiasis.  Sigmoid diverticulosis without diverticulitis I agree with the radiologist interpretation  Afebrile, hemodynamically stable.  62 year old female presenting for evaluation of dysuria.  This been present for the past 4 weeks or so.  She follows with urology for uterine prolapse.  She denies any pain at rest, pain is only present during and immediately after urination.  Urine concerning for UTI with positive nitrites, large leukocytes, greater than 50 white blood cells, many bacteria.  Will send for culture and treat empirically with cefdinir.  CT stone study revealed bladder wall thickening as well.  This is consistent with UTI.  She was encouraged to follow-up with urology regarding her symptoms.  She was given return precautions.  Stable at discharge.  At this time there does not appear to be any evidence of an acute emergency medical condition and the patient appears stable for discharge with appropriate outpatient follow up. Diagnosis was discussed with patient who verbalizes understanding of care plan and is agreeable to discharge. I have discussed return precautions with patient who verbalizes understanding. Patient encouraged to follow-up with their PCP within 1 week. All questions answered.  Note: Portions of this report may have been transcribed using voice recognition software. Every effort was made to ensure accuracy; however, inadvertent computerized transcription errors may still be  present.        Final Clinical Impression(s) / ED Diagnoses Final diagnoses:  Cystitis    Rx / DC Orders ED Discharge Orders          Ordered    cefdinir (OMNICEF) 300 MG capsule  2 times daily        08/16/23 1654              Michelle Piper, Cordelia Poche 08/16/23 1655    Pricilla Loveless, MD 08/17/23 [phone]

## 2023-08-16 NOTE — ED Triage Notes (Signed)
Pt complaining of pain/burning with urination. Pt states this has been going on since halloween. Hx of kidney stones.

## 2023-08-18 ENCOUNTER — Encounter: Payer: Self-pay | Admitting: Certified Registered Nurse Anesthetist

## 2023-08-18 LAB — URINE CULTURE: Culture: >=100000 — AB

## 2023-08-19 ENCOUNTER — Telehealth: Payer: Self-pay | Admitting: Gastroenterology

## 2023-08-19 ENCOUNTER — Telehealth (HOSPITAL_BASED_OUTPATIENT_CLINIC_OR_DEPARTMENT_OTHER): Payer: Self-pay | Admitting: *Deleted

## 2023-08-19 ENCOUNTER — Encounter: Payer: Medicaid Other | Admitting: Gastroenterology

## 2023-08-19 NOTE — Telephone Encounter (Signed)
Good Afternoon Dr. Russella Dar,  I called this patient today at 1:15 pm and did not get anyone to answer and could not leave a message due to memory being full.  I did notice this patient went to the ED on 08-16-23.   I will NO SHOW patient.  Medicaid

## 2023-08-19 NOTE — Telephone Encounter (Signed)
Post ED Visit - Positive Culture Follow-up  Culture report reviewed by antimicrobial stewardship pharmacist: [facility] []  Enzo Bi, Pharm.D. []  Celedonio Miyamoto, Pharm.D., BCPS AQ-ID []  Garvin Fila, Pharm.D., BCPS []  Georgina Pillion, Pharm.D., BCPS []  [place], [address].D., BCPS, AAHIVP []  Estella Husk, Pharm.D., BCPS, AAHIVP []  Lysle Pearl, PharmD, BCPS []  Phillips Climes, PharmD, BCPS []  Agapito Games, PharmD, BCPS []  Verlan Friends, PharmD []  Mervyn Gay, PharmD, BCPS []  Vinnie Level, PharmD  [facility] [x]  Sharin Mons, PharmD []  Greer Pickerel, PharmD []  Adalberto Cole, PharmD []  Perlie Gold, Rph []  Lonell Face) Jean Rosenthal, PharmD []  Earl Many, PharmD []  Junita Push, PharmD []  Dorna Leitz, PharmD []  Terrilee Files, PharmD []  Lynann Beaver, PharmD []  Keturah Barre, PharmD []  Loralee Pacas, PharmD []  Bernadene Person, PharmD   Positive urine culture Treated with Cefdinir, organism sensitive to the same and no further patient follow-up is required at this time.  Beth Walters 08/19/2023, 9:28 AM

## 2023-10-14 ENCOUNTER — Emergency Department (HOSPITAL_COMMUNITY)
Admission: EM | Admit: 2023-10-14 | Discharge: 2023-10-15 | Disposition: A | Discharge status: Home / Self Care | Payer: Medicaid Other | Attending: Emergency Medicine | Admitting: Emergency Medicine

## 2023-10-14 ENCOUNTER — Encounter (HOSPITAL_COMMUNITY): Payer: Self-pay | Admitting: Emergency Medicine

## 2023-10-14 ENCOUNTER — Emergency Department (HOSPITAL_COMMUNITY): Payer: Medicaid Other

## 2023-10-14 ENCOUNTER — Other Ambulatory Visit: Payer: Self-pay

## 2023-10-14 DIAGNOSIS — R1032 Left lower quadrant pain: Secondary | ICD-10-CM | POA: Insufficient documentation

## 2023-10-14 DIAGNOSIS — R319 Hematuria, unspecified: Secondary | ICD-10-CM | POA: Diagnosis not present

## 2023-10-14 DIAGNOSIS — E039 Hypothyroidism, unspecified: Secondary | ICD-10-CM | POA: Insufficient documentation

## 2023-10-14 DIAGNOSIS — R109 Unspecified abdominal pain: Secondary | ICD-10-CM

## 2023-10-14 DIAGNOSIS — Z79899 Other long term (current) drug therapy: Secondary | ICD-10-CM | POA: Insufficient documentation

## 2023-10-14 LAB — CBC WITH DIFFERENTIAL/PLATELET
Abs Immature Granulocytes: 0.02 10*3/uL (ref 0.00–0.07)
Basophils Absolute: 0.1 10*3/uL (ref 0.0–0.1)
Basophils Relative: 1 %
Eosinophils Absolute: 0.5 10*3/uL (ref 0.0–0.5)
Eosinophils Relative: 6 %
HCT: 45.5 % (ref 36.0–46.0)
Hemoglobin: 14.8 g/dL (ref 12.0–15.0)
Immature Granulocytes: 0 %
Lymphocytes Relative: 37 %
Lymphs Abs: 3.1 10*3/uL (ref 0.7–4.0)
MCH: 28.7 pg (ref 26.0–34.0)
MCHC: 32.5 g/dL (ref 30.0–36.0)
MCV: 88.2 fL (ref 80.0–100.0)
Monocytes Absolute: 0.7 10*3/uL (ref 0.1–1.0)
Monocytes Relative: 9 %
Neutro Abs: 4 10*3/uL (ref 1.7–7.7)
Neutrophils Relative %: 47 %
Platelets: 305 10*3/uL (ref 150–400)
RBC: 5.16 MIL/uL — ABNORMAL HIGH (ref 3.87–5.11)
RDW: 13.2 % (ref 11.5–15.5)
WBC: 8.5 10*3/uL (ref 4.0–10.5)
nRBC: 0 % (ref 0.0–0.2)

## 2023-10-14 LAB — COMPREHENSIVE METABOLIC PANEL
ALT: 17 U/L (ref 0–44)
AST: 16 U/L (ref 15–41)
Albumin: 4.7 g/dL (ref 3.5–5.0)
Alkaline Phosphatase: 79 U/L (ref 38–126)
Anion gap: 10 (ref 5–15)
BUN: 7 mg/dL — ABNORMAL LOW (ref 8–23)
CO2: 24 mmol/L (ref 22–32)
Calcium: 9.7 mg/dL (ref 8.9–10.3)
Chloride: 103 mmol/L (ref 98–111)
Creatinine, Ser: 0.54 mg/dL (ref 0.44–1.00)
GFR, Estimated: >60 mL/min (ref >60)
Glucose, Bld: 95 mg/dL (ref 70–99)
Potassium: 3.2 mmol/L — ABNORMAL LOW (ref 3.5–5.1)
Sodium: 137 mmol/L (ref 135–145)
Total Bilirubin: 0.6 mg/dL (ref 0.0–1.2)
Total Protein: 8.4 g/dL — ABNORMAL HIGH (ref 6.5–8.1)

## 2023-10-14 LAB — LIPASE, BLOOD: Lipase: 36 U/L (ref 11–51)

## 2023-10-14 MED ORDER — OXYCODONE HCL 5 MG PO TABS
5.0000 mg | ORAL_TABLET | Freq: Once | ORAL | Status: DC
  Filled 2023-10-14: qty 1

## 2023-10-14 MED ORDER — ONDANSETRON 4 MG PO TBDP
4.0000 mg | ORAL_TABLET | Freq: Once | ORAL | Status: DC
  Filled 2023-10-14: qty 1

## 2023-10-14 MED ORDER — IOHEXOL 300 MG/ML  SOLN
100.0000 mL | Freq: Once | INTRAMUSCULAR | Status: AC | PRN
  Administered 2023-10-14: 100 mL via INTRAVENOUS

## 2023-10-14 NOTE — ED Notes (Addendum)
Pt refused meds , r/t being allergic , meds were returned, IV placed, pt educated on if she desires to leave at any time to left the front desk know and we will gladly remove her IV

## 2023-10-14 NOTE — ED Triage Notes (Addendum)
Patient c/o abdominal pain x 1 week.  Patient report taking PRN medication without relief. Patient report nausea, denies vomitting. Patient denies fever.

## 2023-10-14 NOTE — ED Provider Triage Note (Signed)
Emergency Medicine Provider Triage Evaluation Note  Beth Walters , a 63 y.o. female  was evaluated in triage.  Pt complains of left lower quadrant pain and fevers for the past few days.  Patient states she does have history of diverticulitis and feels that this is similar.  Patient does have nausea and vomiting with this as well and states she has not had a bowel movement.  Patient feels that her belly is more distended than normal..  Review of Systems  Positive:  Negative:   Physical Exam  BP (!) 147/84   Pulse 83   Temp 98.4 F (36.9 C) (Oral)   Resp 18   Ht 5\' 4"  (1.626 m)   Wt 72.6 kg   LMP 03/19/2011   SpO2 93%   BMI 27.47 kg/m  Gen:   Awake, no distress   Resp:  Normal effort  MSK:   Moves extremities without difficulty  Other:  Left lower quadrant tenderness without peritoneal signs  Medical Decision Making  Medically screening exam initiated at 8:28 PM.  Appropriate orders placed.  Beth Walters was informed that the remainder of the evaluation will be completed by another provider, this initial triage assessment does not replace that evaluation, and the importance of remaining in the ED until their evaluation is complete.  Workup initiated, patient stable at this time   Remi Deter 10/14/23 2028

## 2023-10-15 LAB — URINALYSIS, ROUTINE W REFLEX MICROSCOPIC
Bacteria, UA: NONE SEEN
Bilirubin Urine: NEGATIVE
Glucose, UA: NEGATIVE mg/dL
Ketones, ur: NEGATIVE mg/dL
Leukocytes,Ua: NEGATIVE
Nitrite: NEGATIVE
Protein, ur: NEGATIVE mg/dL
Specific Gravity, Urine: >1.046 — ABNORMAL HIGH (ref 1.005–1.030)
pH: 6 (ref 5.0–8.0)

## 2023-10-15 MED ORDER — ONDANSETRON HCL 4 MG/2ML IJ SOLN
4.0000 mg | Freq: Once | INTRAMUSCULAR | Status: AC
  Administered 2023-10-15: 4 mg via INTRAVENOUS
  Filled 2023-10-15: qty 2

## 2023-10-15 MED ORDER — SODIUM CHLORIDE 0.9 % IV BOLUS
500.0000 mL | Freq: Once | INTRAVENOUS | Status: AC
  Administered 2023-10-15: 500 mL via INTRAVENOUS

## 2023-10-15 MED ORDER — HYDROMORPHONE HCL 1 MG/ML IJ SOLN
1.0000 mg | Freq: Once | INTRAMUSCULAR | Status: DC
  Filled 2023-10-15: qty 1

## 2023-10-15 MED ORDER — MORPHINE SULFATE (PF) 4 MG/ML IV SOLN
4.0000 mg | Freq: Once | INTRAVENOUS | Status: AC
  Administered 2023-10-15: 4 mg via INTRAVENOUS
  Filled 2023-10-15: qty 1

## 2023-10-15 MED ORDER — MORPHINE SULFATE 15 MG PO TABS: 15.0000 mg | ORAL_TABLET | Freq: Every day | ORAL | 0 refills | Status: AC | PRN

## 2023-10-15 MED ORDER — TAMSULOSIN HCL 0.4 MG PO CAPS
0.4000 mg | ORAL_CAPSULE | Freq: Once | ORAL | Status: AC
  Administered 2023-10-15: 0.4 mg via ORAL
  Filled 2023-10-15: qty 1

## 2023-10-15 MED ORDER — MORPHINE SULFATE (PF) 2 MG/ML IV SOLN
2.0000 mg | Freq: Once | INTRAVENOUS | Status: DC
  Filled 2023-10-15: qty 1

## 2023-10-15 MED ORDER — MORPHINE SULFATE 15 MG PO TABS
15.0000 mg | ORAL_TABLET | Freq: Once | ORAL | Status: AC
  Administered 2023-10-15: 15 mg via ORAL
  Filled 2023-10-15: qty 1

## 2023-10-15 NOTE — ED Notes (Signed)
Patient d.c with visitor at bedside. IV discontinued

## 2023-10-15 NOTE — ED Provider Notes (Signed)
[place] EMERGENCY DEPARTMENT AT [facility] Provider Note  CSN: [id number] Arrival date & time: 10/14/23 1927  Chief Complaint(s) Abdominal Pain  HPI Beth Walters is a 63 y.o. female with a past medical history listed below who presents to the emergency department with left-sided abdominal pain ongoing for 1 week.  Intermittent in nature.  Reports nausea without emesis.  No fevers or chills.  No cough or congestion.  No diarrhea.  No urinary symptoms.   Abdominal Pain   Past Medical History Past Medical History:  Diagnosis Date   Allergy    Anxiety    Arthritis    Chronic leg pain    Depression    Diverticulitis of colon 01/2011   History of kidney stones    HSV infection    Hyperlipidemia    Hypothyroidism    PONV (postoperative nausea and vomiting)    with knee surgery- had to have stomach pumped   RLS (restless legs syndrome)    Patient Active Problem List   Diagnosis Date Noted   Left lower quadrant pain 04/23/2018   Nausea without vomiting 04/23/2018   History of diverticulitis 04/23/2018   History of colon polyps 04/23/2018   Home Medication(s) Prior to Admission medications   Medication Sig Start Date End Date Taking? Authorizing Provider  ALPRAZolam Prudy Feeler) 0.5 MG tablet Take 0.5 mg by mouth 2 (two) times daily as needed.      [provider]  hydrocortisone 2.5 % ointment SMARTSIG:Sparingly Topical Twice Daily 06/28/23   [provider]  levothyroxine (SYNTHROID, LEVOTHROID) 50 MCG tablet Take 1 tablet by mouth daily. 03/26/18   [provider]  morphine (MSIR) 15 MG tablet Take 1 tablet (15 mg total) by mouth daily as needed. 10/15/23   Nira Conn, MD  Polyethylene Glycol 3350 (MIRALAX PO) Take 17 g by mouth daily.    [provider]  promethazine (PHENERGAN) 25 MG tablet Take 1-2 tablets by mouth daily as needed. Patient not taking: Reported on 07/24/2023 03/28/18   [provider]   rosuvastatin (CRESTOR) 40 MG tablet Take 20 mg by mouth daily. 20 mg every 3rd day    [provider]  valACYclovir (VALTREX) 1000 MG tablet Take 1,000 mg by mouth 2 (two) times daily. 07/03/23   [provider]  zolpidem (AMBIEN) 10 MG tablet Take 10 mg by mouth at bedtime as needed for sleep.    [provider]                                                                                                                                    Allergies Bee venom, Acetaminophen, Aspirin, Benadryl [diphenhydramine hcl], Citalopram, Clonidine derivatives, Codeine, Demerol, Fluoxetine, Hydrocodone, Ibuprofen, Lexapro [escitalopram], Lipitor [atorvastatin], Lovastatin, Meperidine and related, Metronidazole, Niaspan [niacin], Penicillins, Pravastatin, Simvastatin, and Tramadol  Review of Systems Review of Systems  Gastrointestinal:  Positive for abdominal pain.   As  noted in HPI  Physical Exam Vital Signs  I have reviewed the triage vital signs BP (!) 149/75   Pulse 77   Temp 98.3 F (36.8 C) (Oral)   Resp 18   Ht 5\' 4"  (1.626 m)   Wt 72.6 kg   LMP 03/19/2011   SpO2 98%   BMI 27.47 kg/m   Physical Exam Vitals reviewed.  Constitutional:      General: She is not in acute distress.    Appearance: She is well-developed. She is not diaphoretic.  HENT:     Head: Normocephalic and atraumatic.     Right Ear: External ear normal.     Left Ear: External ear normal.     Nose: Nose normal.  Eyes:     General: No scleral icterus.    Conjunctiva/sclera: Conjunctivae normal.  Neck:     Trachea: Phonation normal.  Cardiovascular:     Rate and Rhythm: Normal rate and regular rhythm.  Pulmonary:     Effort: Pulmonary effort is normal. No respiratory distress.     Breath sounds: No stridor.  Abdominal:     General: There is no distension.     Tenderness: There is no abdominal tenderness.  Musculoskeletal:        General: Normal range of motion.     Cervical  back: Normal range of motion.  Neurological:     Mental Status: She is alert and oriented to person, place, and time.  Psychiatric:        Behavior: Behavior normal.     ED Results and Treatments Labs (all labs ordered are listed, but only abnormal results are displayed) Labs Reviewed  CBC WITH DIFFERENTIAL/PLATELET - Abnormal; Notable for the following components:      Result Value   RBC 5.16 (*)    All other components within normal limits  COMPREHENSIVE METABOLIC PANEL - Abnormal; Notable for the following components:   Potassium 3.2 (*)    BUN 7 (*)    Total Protein 8.4 (*)    All other components within normal limits  URINALYSIS, ROUTINE W REFLEX MICROSCOPIC - Abnormal; Notable for the following components:   Color, Urine GREEN (*)    Specific Gravity, Urine >1.046 (*)    Hgb urine dipstick SMALL (*)    All other components within normal limits  LIPASE, BLOOD                                                                                                                         EKG  EKG Interpretation Date/Time:    Ventricular Rate:    PR Interval:    QRS Duration:    QT Interval:    QTC Calculation:   R Axis:      Text Interpretation:         Radiology CT ABDOMEN PELVIS W CONTRAST Result Date: 10/14/2023 CLINICAL DATA:  Left lower quadrant pain, history of diverticular change EXAM: CT ABDOMEN AND PELVIS WITH CONTRAST TECHNIQUE: Multidetector  CT imaging of the abdomen and pelvis was performed using the standard protocol following bolus administration of intravenous contrast. RADIATION DOSE REDUCTION: This exam was performed according to the departmental dose-optimization program which includes automated exposure control, adjustment of the mA and/or kV according to patient size and/or use of iterative reconstruction technique. CONTRAST:  OMNIPAQUE IOHEXOL 300 MG/ML  SOLN COMPARISON:  08/16/2023 FINDINGS: Lower chest: No acute abnormality. Hepatobiliary: No focal  liver abnormality is seen. No gallstones, gallbladder wall thickening, or biliary dilatation. Pancreas: Unremarkable. No pancreatic ductal dilatation or surrounding inflammatory changes. Spleen: Normal in size without focal abnormality. Adrenals/Urinary Tract: Adrenal glands show a normal appearing right adrenal. Left adrenal gland shows a focal adenoma stable over multiple previous exams dating back to 2012. No further follow-up is recommended. Kidneys demonstrate a normal enhancement pattern bilaterally. Left lower pole nonobstructing renal stone is noted measuring 6 mm. The ureters are within normal limits. The bladder is within normal limits. Stomach/Bowel: Scattered diverticular changes noted. No definitive diverticulitis is noted. Air and fecal material are seen throughout the colon. The appendix is within normal limits. Small bowel is within normal limits. Hiatal hernia is noted. Vascular/Lymphatic: No significant vascular findings are present. No enlarged abdominal or pelvic lymph nodes. Reproductive: Status post hysterectomy. No adnexal masses. Other: No abdominal wall hernia or abnormality. No abdominopelvic ascites. Musculoskeletal: No acute or significant osseous findings. IMPRESSION: Diverticulosis without diverticulitis. Hiatal hernia is again noted. Obstructing 6 mm left renal stone Stable left adrenal adenoma Electronically Signed   By: Alcide Clever M.D.   On: 10/14/2023 23:04    Medications Ordered in ED Medications  ondansetron (ZOFRAN-ODT) disintegrating tablet 4 mg (4 mg Oral Not Given 10/14/23 2035)  oxyCODONE (Oxy IR/ROXICODONE) immediate release tablet 5 mg (5 mg Oral Not Given 10/14/23 2035)  morphine (PF) 2 MG/ML injection 2 mg (2 mg Intravenous Not Given 10/15/23 0327)  iohexol (OMNIPAQUE) 300 MG/ML solution 100 mL (100 mLs Intravenous Contrast Given 10/14/23 2217)  tamsulosin (FLOMAX) capsule 0.4 mg (0.4 mg Oral Given 10/15/23 0134)  ondansetron (ZOFRAN) injection 4 mg (4 mg  Intravenous Given 10/15/23 0153)  sodium chloride 0.9 % bolus 500 mL (0 mLs Intravenous Stopped 10/15/23 0218)  morphine (PF) 4 MG/ML injection 4 mg (4 mg Intravenous Given 10/15/23 0153)  morphine (MSIR) tablet 15 mg (15 mg Oral Given 10/15/23 0327)   Procedures Procedures  (including critical care time) Medical Decision Making / ED Course   Medical Decision Making Risk Prescription drug management.    Left-sided abdominal pain differential and workup listed  CBC without leukocytosis or anemia CMP without significant electrolyte derangements or renal sufficiency No evidence of bili obstruction or pancreatitis UA without evidence of infection CT scan ordered in triage notable for 6 mm nonobstructing renal stone.  UA does have hematuria.  Since the CT scan was done with contrast.  Possible that patient has small stone causing her pain.  Pain is well-controlled now with IV and oral pain medicine.  She already has prescription for Flomax.  No other intra-abdominal inflammatory/infectious processes noted on imaging.    Final Clinical Impression(s) / ED Diagnoses Final diagnoses:  Left flank pain   The patient appears reasonably screened and/or stabilized for discharge and I doubt any other medical condition or other [facility] requiring further screening, evaluation, or treatment in the ED at this time. I have discussed the findings, Dx and Tx plan with the patient/family who expressed understanding and agree(s) with the plan. Discharge instructions discussed at length.  The patient/family was given strict return precautions who verbalized understanding of the instructions. No further questions at time of discharge.  Disposition: Discharge  Condition: Good  ED Discharge Orders          Ordered    morphine (MSIR) 15 MG tablet  Daily PRN        10/15/23 0340              Follow Up: Mosetta Putt, MD [address] [place] [address] [phone]  Call  to schedule an  appointment for close follow up  McKenzie, Mardene Celeste, MD [address] [place] [address] [phone]  Call  to schedule an appointment for close follow up    This chart was dictated using voice recognition software.  Despite best efforts to proofread,  errors can occur which can change the documentation meaning.    Nira Conn, MD 10/15/23 [phone]

## 2023-10-18 ENCOUNTER — Other Ambulatory Visit: Payer: Self-pay | Admitting: Urology

## 2023-10-22 ENCOUNTER — Encounter (HOSPITAL_BASED_OUTPATIENT_CLINIC_OR_DEPARTMENT_OTHER): Payer: Self-pay | Admitting: Urology

## 2023-10-22 NOTE — Progress Notes (Signed)
 Spoke w/ via phone for pre-op interview--- Beth Walters needs dos----         n/a      COVID test -----patient states asymptomatic no test needed Arrive at ------- 0600 NPO after MN NO Solid Food.  Clear liquids from MN until--- NPO Med rec completed. Pt aware to hold ASA/NSAIDs and supplements per [facility] protocol.  Medications to take morning of surgery ----- n/a, per pt takes meds in evening Diabetic/Weight loss medication ----- n/a No Alcohol or recreational drugs for 24 hours/Tobacco products for 6 hours ---- pt aware Patient instructed to bring blue lithotripsy folder, photo id and insurance card day of surgery. Patient aware to have Driver (ride ) / caregiver for 24 hours after surgery ----- [name] (s/o) Patient Special Instructions ----- Pt requested mouthguard for procedure. Pt informed that she may be required to bring in her own as this is not usually provided. Pt encouraged to leave partial/dentures at home if concerned.  Pre-Op special Instructions ----- take laxative of choice day before procedure Patient verbalized understanding of instructions that were given at this phone interview. Patient denies shortness of breath, chest pain, fever, cough at this phone interview.

## 2023-10-25 ENCOUNTER — Encounter (HOSPITAL_BASED_OUTPATIENT_CLINIC_OR_DEPARTMENT_OTHER): Payer: Self-pay | Admitting: Urology

## 2023-10-25 ENCOUNTER — Ambulatory Visit (HOSPITAL_BASED_OUTPATIENT_CLINIC_OR_DEPARTMENT_OTHER)
Admission: RE | Admit: 2023-10-25 | Discharge: 2023-10-25 | Disposition: A | Discharge status: Home / Self Care | Payer: Medicaid Other | Attending: Urology | Admitting: Urology

## 2023-10-25 ENCOUNTER — Ambulatory Visit (HOSPITAL_COMMUNITY): Payer: Medicaid Other

## 2023-10-25 ENCOUNTER — Other Ambulatory Visit: Payer: Self-pay

## 2023-10-25 ENCOUNTER — Encounter (HOSPITAL_BASED_OUTPATIENT_CLINIC_OR_DEPARTMENT_OTHER)
Admission: RE | Disposition: A | Discharge status: Home / Self Care | Payer: Self-pay | Source: Home / Self Care | Attending: Urology

## 2023-10-25 DIAGNOSIS — N200 Calculus of kidney: Secondary | ICD-10-CM | POA: Diagnosis present

## 2023-10-25 HISTORY — PX: EXTRACORPOREAL SHOCK WAVE LITHOTRIPSY: SHX1557

## 2023-10-25 SURGERY — LITHOTRIPSY, ESWL
Anesthesia: LOCAL | Laterality: Left

## 2023-10-25 MED ORDER — DIAZEPAM 5 MG PO TABS
10.0000 mg | ORAL_TABLET | ORAL | Status: AC
  Administered 2023-10-25: 10 mg via ORAL

## 2023-10-25 MED ORDER — SODIUM CHLORIDE 0.9 % IV SOLN: INTRAVENOUS | Status: DC

## 2023-10-25 MED ORDER — DIPHENHYDRAMINE HCL 25 MG PO CAPS: 25.0000 mg | ORAL_CAPSULE | ORAL | Status: DC

## 2023-10-25 MED ORDER — CIPROFLOXACIN HCL 500 MG PO TABS
ORAL_TABLET | ORAL | Status: AC
  Filled 2023-10-25: qty 1

## 2023-10-25 MED ORDER — DIAZEPAM 5 MG PO TABS
ORAL_TABLET | ORAL | Status: AC
  Filled 2023-10-25: qty 2

## 2023-10-25 MED ORDER — CIPROFLOXACIN HCL 500 MG PO TABS
500.0000 mg | ORAL_TABLET | ORAL | Status: AC
  Administered 2023-10-25: 500 mg via ORAL

## 2023-10-25 NOTE — Op Note (Signed)
 See [employer] OP note scanned into chart. Also because of the size, density, location and other factors that cannot be anticipated I feel this will likely be a staged procedure. This fact supersedes any indication in the scanned [place] [facility] operative note to the contrary.

## 2023-10-25 NOTE — H&P (Signed)
 See HP scanned in

## 2023-10-25 NOTE — Discharge Instructions (Addendum)
 See [facility] discharge instructions in chart.   Post Anesthesia Home Care Instructions  Activity: Get plenty of rest for the remainder of the day. A responsible individual must stay with you for 24 hours following the procedure.  For the next 24 hours, DO NOT: -Drive a car -Advertising copywriter -Drink alcoholic beverages -Take any medication unless instructed by your physician -Make any legal decisions or sign important papers.  Meals: Start with liquid foods such as gelatin or soup. Progress to regular foods as tolerated. Avoid greasy, spicy, heavy foods. If nausea and/or vomiting occur, drink only clear liquids until the nausea and/or vomiting subsides. Call your physician if vomiting continues.  Special Instructions/Symptoms: Your throat may feel dry or sore from the anesthesia or the breathing tube placed in your throat during surgery. If this causes discomfort, gargle with warm salt water. The discomfort should disappear within 24 hours.

## 2023-10-28 ENCOUNTER — Encounter (HOSPITAL_BASED_OUTPATIENT_CLINIC_OR_DEPARTMENT_OTHER): Payer: Self-pay | Admitting: Urology
